# Patient Record
Sex: Female | Born: 1990 | Race: Black or African American | Hispanic: No | Marital: Single | Smoking: Never smoker
Health system: Southern US, Community
[De-identification: ages and names within clinical notes are randomized; demographics above are authoritative.]

## PROBLEM LIST (undated history)

## (undated) DIAGNOSIS — I10 Essential (primary) hypertension: Secondary | ICD-10-CM

---

## 2016-12-14 ENCOUNTER — Emergency Department (HOSPITAL_BASED_OUTPATIENT_CLINIC_OR_DEPARTMENT_OTHER): Payer: Medicaid Other

## 2016-12-14 ENCOUNTER — Encounter (HOSPITAL_BASED_OUTPATIENT_CLINIC_OR_DEPARTMENT_OTHER): Payer: Self-pay | Admitting: Emergency Medicine

## 2016-12-14 ENCOUNTER — Emergency Department (HOSPITAL_BASED_OUTPATIENT_CLINIC_OR_DEPARTMENT_OTHER)
Admission: EM | Admit: 2016-12-14 | Discharge: 2016-12-14 | Disposition: A | Discharge status: Home / Self Care | Payer: Medicaid Other | Attending: Emergency Medicine | Admitting: Emergency Medicine

## 2016-12-14 DIAGNOSIS — R109 Unspecified abdominal pain: Secondary | ICD-10-CM

## 2016-12-14 DIAGNOSIS — R1032 Left lower quadrant pain: Secondary | ICD-10-CM | POA: Diagnosis present

## 2016-12-14 DIAGNOSIS — N12 Tubulo-interstitial nephritis, not specified as acute or chronic: Secondary | ICD-10-CM | POA: Diagnosis not present

## 2016-12-14 DIAGNOSIS — R52 Pain, unspecified: Secondary | ICD-10-CM

## 2016-12-14 LAB — CBC WITH DIFFERENTIAL/PLATELET
BASOS ABS: 0 10*3/uL (ref 0.0–0.1)
BASOS PCT: 0 %
EOS ABS: 0.1 10*3/uL (ref 0.0–0.7)
Eosinophils Relative: 1 %
HCT: 35.9 % — ABNORMAL LOW (ref 36.0–46.0)
Hemoglobin: 11.5 g/dL — ABNORMAL LOW (ref 12.0–15.0)
Lymphocytes Relative: 19 %
Lymphs Abs: 1.5 10*3/uL (ref 0.7–4.0)
MCH: 24.7 pg — ABNORMAL LOW (ref 26.0–34.0)
MCHC: 32 g/dL (ref 30.0–36.0)
MCV: 77 fL — ABNORMAL LOW (ref 78.0–100.0)
MONO ABS: 1.1 10*3/uL — AB (ref 0.1–1.0)
MONOS PCT: 14 %
NEUTROS PCT: 66 %
Neutro Abs: 5.3 10*3/uL (ref 1.7–7.7)
Platelets: 232 10*3/uL (ref 150–400)
RBC: 4.66 MIL/uL (ref 3.87–5.11)
RDW: 15.3 % (ref 11.5–15.5)
WBC: 8 10*3/uL (ref 4.0–10.5)

## 2016-12-14 LAB — BASIC METABOLIC PANEL
Anion gap: 9 (ref 5–15)
BUN: 8 mg/dL (ref 6–20)
CALCIUM: 9.1 mg/dL (ref 8.9–10.3)
CO2: 22 mmol/L (ref 22–32)
CREATININE: 0.75 mg/dL (ref 0.44–1.00)
Chloride: 105 mmol/L (ref 101–111)
GFR calc Af Amer: >60 mL/min (ref >60)
GLUCOSE: 92 mg/dL (ref 65–99)
Potassium: 3.8 mmol/L (ref 3.5–5.1)
Sodium: 136 mmol/L (ref 135–145)

## 2016-12-14 LAB — URINALYSIS, ROUTINE W REFLEX MICROSCOPIC
Bilirubin Urine: NEGATIVE
GLUCOSE, UA: NEGATIVE mg/dL
KETONES UR: NEGATIVE mg/dL
NITRITE: POSITIVE — AB
PROTEIN: 30 mg/dL — AB
Specific Gravity, Urine: 1.013 (ref 1.005–1.030)
pH: 6 (ref 5.0–8.0)

## 2016-12-14 LAB — URINALYSIS, MICROSCOPIC (REFLEX)

## 2016-12-14 LAB — PREGNANCY, URINE: Preg Test, Ur: NEGATIVE

## 2016-12-14 MED ORDER — ONDANSETRON 4 MG PO TBDP
4.0000 mg | ORAL_TABLET | Freq: Once | ORAL | Status: AC
  Administered 2016-12-14: 4 mg via ORAL
  Filled 2016-12-14: qty 1

## 2016-12-14 MED ORDER — HYDROCODONE-ACETAMINOPHEN 5-325 MG PO TABS: 1.0000 | ORAL_TABLET | ORAL | 0 refills | Status: AC | PRN

## 2016-12-14 MED ORDER — PROMETHAZINE HCL 25 MG PO TABS
25.0000 mg | ORAL_TABLET | Freq: Once | ORAL | Status: AC
  Administered 2016-12-14: 25 mg via ORAL
  Filled 2016-12-14: qty 1

## 2016-12-14 MED ORDER — MORPHINE SULFATE (PF) 4 MG/ML IV SOLN
4.0000 mg | Freq: Once | INTRAVENOUS | Status: AC
  Administered 2016-12-14: 4 mg via INTRAMUSCULAR

## 2016-12-14 MED ORDER — ONDANSETRON HCL 4 MG PO TABS: 4.0000 mg | ORAL_TABLET | Freq: Three times a day (TID) | ORAL | 0 refills | Status: AC | PRN

## 2016-12-14 MED ORDER — ONDANSETRON HCL 4 MG/2ML IJ SOLN
4.0000 mg | Freq: Once | INTRAMUSCULAR | Status: DC
  Filled 2016-12-14: qty 2

## 2016-12-14 MED ORDER — SODIUM CHLORIDE 0.9 % IV BOLUS (SEPSIS)
500.0000 mL | Freq: Once | INTRAVENOUS | Status: AC
  Administered 2016-12-14: 500 mL via INTRAVENOUS

## 2016-12-14 MED ORDER — ONDANSETRON HCL 4 MG/2ML IJ SOLN
4.0000 mg | Freq: Once | INTRAMUSCULAR | Status: AC
  Administered 2016-12-14: 4 mg via INTRAVENOUS
  Filled 2016-12-14: qty 2

## 2016-12-14 MED ORDER — MORPHINE SULFATE (PF) 4 MG/ML IV SOLN
4.0000 mg | Freq: Once | INTRAVENOUS | Status: DC
Start: 2016-12-14 — End: 2016-12-14
  Filled 2016-12-14: qty 1

## 2016-12-14 MED ORDER — ONDANSETRON HCL 4 MG PO TABS: 4.0000 mg | ORAL_TABLET | Freq: Three times a day (TID) | ORAL | 0 refills | Status: DC | PRN

## 2016-12-14 MED ORDER — HYDROMORPHONE HCL 1 MG/ML IJ SOLN
1.0000 mg | Freq: Once | INTRAMUSCULAR | Status: AC
  Administered 2016-12-14: 1 mg via INTRAVENOUS
  Filled 2016-12-14: qty 1

## 2016-12-14 MED ORDER — HYDROCODONE-ACETAMINOPHEN 5-325 MG PO TABS: 1.0000 | ORAL_TABLET | ORAL | 0 refills | Status: DC | PRN

## 2016-12-14 MED ORDER — DEXTROSE 5 % IV SOLN
1.0000 g | Freq: Once | INTRAVENOUS | Status: AC
  Administered 2016-12-14: 1 g via INTRAVENOUS
  Filled 2016-12-14: qty 10

## 2016-12-14 MED ORDER — CEPHALEXIN 500 MG PO CAPS: 500.0000 mg | ORAL_CAPSULE | Freq: Three times a day (TID) | ORAL | 0 refills | Status: AC

## 2016-12-14 MED ORDER — CEPHALEXIN 500 MG PO CAPS: 500.0000 mg | ORAL_CAPSULE | Freq: Three times a day (TID) | ORAL | 0 refills | Status: DC

## 2016-12-14 MED FILL — HYDROCODON-APAP 5-325: 5-325 | 2 days supply | Qty: 15 | Fill #0

## 2016-12-14 MED FILL — CEPHALEXIN 500 MG CAPSULE: 500 | 10 days supply | Qty: 30 | Fill #0

## 2016-12-14 MED FILL — ONDANSETRON HCL 4 MG TABLET: 4 | 5 days supply | Qty: 15 | Fill #0

## 2016-12-14 NOTE — ED Triage Notes (Signed)
Patient states that she woke up yesterday with pain after consuming ETOH the night before. The patient reports that she is having pain to her left lower back and radiates to her groin. PAtient states that the pain is worse with movement

## 2016-12-14 NOTE — ED Notes (Signed)
Left flank pain since yesterday, nausea

## 2016-12-14 NOTE — ED Notes (Signed)
Refuses pelvic exam, per ED provider

## 2016-12-14 NOTE — ED Notes (Signed)
Patient transported to Ultrasound 

## 2016-12-14 NOTE — Discharge Instructions (Signed)
Read the information below.  Use the prescribed medication as directed.  Please discuss all new medications with your pharmacist.  Do not take additional tylenol while taking the prescribed pain medication to avoid overdose.  You may return to the Emergency Department at any time for worsening condition or any new symptoms that concern you.    If you develop high fevers, worsening abdominal pain, uncontrolled vomiting, or are unable to tolerate fluids by mouth, return to the ER for a recheck.   °

## 2016-12-14 NOTE — ED Provider Notes (Signed)
[facility]   CSN: [id number] Arrival date & time: 12/14/16  1224     History   Chief Complaint Chief Complaint  Patient presents with  . Back Pain    HPI Stephanie Washington is a 26 y.o. female.  HPI   Patient presents with left flank pain that began last night around 8pm.  States it began just as she took a shot of liquor.  The pain is constant, described as someone "pummeling" her side, with associated pressure in her rectum and nausea.  Has had similar pain with kidney infection.  Denies vomiting, urinary, vaginal, or bowel complaints.  Had normal BM this morning.  LMP was last week and on time.  She is s/p tubal ligation.  Has never had kidney stone or ovarian cyst.  Has been monogamous with one man x 7 years, doubts STI.  Denies any possibility of trauma, no recent falls or injuries.    History reviewed. No pertinent past medical history.  There are no active problems to display for this patient.   History reviewed. No pertinent surgical history.  OB History    No data available       Home Medications    Prior to Admission medications   Medication Sig Start Date End Date Taking? Authorizing Provider  cephALEXin (KEFLEX) 500 MG capsule Take 1 capsule (500 mg total) by mouth 3 (three) times daily. 12/14/16 12/24/16  Trixie DredgeEmily Roderick Calo, PA-C  HYDROcodone-acetaminophen (NORCO/VICODIN) 5-325 MG tablet Take 1-2 tablets by mouth every 4 (four) hours as needed for moderate pain or severe pain. 12/14/16   Trixie DredgeEmily Jubilee Vivero, PA-C  ondansetron (ZOFRAN) 4 MG tablet Take 1 tablet (4 mg total) by mouth every 8 (eight) hours as needed for nausea or vomiting. 12/14/16   Trixie DredgeEmily Karess Harner, PA-C    Family History History reviewed. No pertinent family history.  Social History Social History  Substance Use Topics  . Smoking status: Never Smoker  . Smokeless tobacco: Never Used  . Alcohol use 14.4 oz/week    24 Cans of beer per week     Allergies   Patient has no known  allergies.   Review of Systems Review of Systems  All other systems reviewed and are negative.    Physical Exam Updated Vital Signs BP 100/59 (BP Location: Right Arm)   Pulse 69   Temp 98 F (36.7 C) (Oral)   Resp 16   Ht 5' (1.524 m)   Wt 59 kg   LMP 12/03/2016   SpO2 100%   BMI 25.39 kg/m   Physical Exam  Constitutional: She appears well-developed and well-nourished. No distress.  HENT:  Head: Normocephalic and atraumatic.  Neck: Neck supple.  Cardiovascular: Normal rate and regular rhythm.   Pulmonary/Chest: Effort normal and breath sounds normal. No respiratory distress. She has no wheezes. She has no rales.  Abdominal: Soft. Bowel sounds are normal. She exhibits no distension. There is tenderness in the left upper quadrant and left lower quadrant. There is CVA tenderness (left). There is no rebound and no guarding.  Tenderness throughout left abdomen, flank, and back.  No skin changes.   Neurological: She is alert.  Skin: She is not diaphoretic.  Nursing note and vitals reviewed.    ED Treatments / Results  Labs (all labs ordered are listed, but only abnormal results are displayed) Labs Reviewed  URINALYSIS, ROUTINE W REFLEX MICROSCOPIC - Abnormal; Notable for the following:       Result Value   APPearance CLOUDY (*)  Hgb urine dipstick SMALL (*)    Protein, ur 30 (*)    Nitrite POSITIVE (*)    Leukocytes, UA LARGE (*)    All other components within normal limits  CBC WITH DIFFERENTIAL/PLATELET - Abnormal; Notable for the following:    Hemoglobin 11.5 (*)    HCT 35.9 (*)    MCV 77.0 (*)    MCH 24.7 (*)    Monocytes Absolute 1.1 (*)    All other components within normal limits  URINALYSIS, MICROSCOPIC (REFLEX) - Abnormal; Notable for the following:    Bacteria, UA MANY (*)    Squamous Epithelial / LPF 0-5 (*)    All other components within normal limits  URINE CULTURE  PREGNANCY, URINE  BASIC METABOLIC PANEL  RPR  HIV ANTIBODY (ROUTINE TESTING)     EKG  EKG Interpretation None       Radiology US Transvaginal Non-ob  Result Date: 12/14/2016 CLINICAL DATA:  Severe left flank pain radiating to left lower quadrant EXAM: TRANSABDOMINAL AND TRANSVAGINAL ULTRASOUND OF PELVIS DOPPLER ULTRASOUND OF OVARIES TECHNIQUE: Both transabdominal and transvaginal ultrasound examinations of the pelvis were performed. Transabdominal technique was performed for global imaging of the pelvis including uterus, ovaries, adnexal regions, and pelvic cul-de-sac. It was necessary to proceed with endovaginal exam following the transabdominal exam to visualize the endometrium and bilateral ovaries. Color and duplex Doppler ultrasound was utilized to evaluate blood flow to the ovaries. COMPARISON:  None. FINDINGS: Uterus Measurements: 8.9 x 4.6 x 5.5 cm. No fibroids or other mass visualized. Endometrium Thickness: 7 mm.  No focal abnormality visualized. Right ovary Measurements: 3.4 x 2.0 x 1.9 cm. Normal appearance/no adnexal mass. Left ovary Measurements: 4.2 x 1.4 x 2.1 cm. Normal appearance/no adnexal mass. Pulsed Doppler evaluation of both ovaries demonstrates normal low-resistance arterial and venous waveforms. Other findings Small volume pelvic ascites. IMPRESSION: Negative pelvic ultrasound. No evidence of ovarian torsion. Electronically Signed   By: Charline Bills M.D.   On: 12/14/2016 14:59   US Pelvis Complete  Result Date: 12/14/2016 CLINICAL DATA:  Severe left flank pain radiating to left lower quadrant EXAM: TRANSABDOMINAL AND TRANSVAGINAL ULTRASOUND OF PELVIS DOPPLER ULTRASOUND OF OVARIES TECHNIQUE: Both transabdominal and transvaginal ultrasound examinations of the pelvis were performed. Transabdominal technique was performed for global imaging of the pelvis including uterus, ovaries, adnexal regions, and pelvic cul-de-sac. It was necessary to proceed with endovaginal exam following the transabdominal exam to visualize the endometrium and bilateral  ovaries. Color and duplex Doppler ultrasound was utilized to evaluate blood flow to the ovaries. COMPARISON:  None. FINDINGS: Uterus Measurements: 8.9 x 4.6 x 5.5 cm. No fibroids or other mass visualized. Endometrium Thickness: 7 mm.  No focal abnormality visualized. Right ovary Measurements: 3.4 x 2.0 x 1.9 cm. Normal appearance/no adnexal mass. Left ovary Measurements: 4.2 x 1.4 x 2.1 cm. Normal appearance/no adnexal mass. Pulsed Doppler evaluation of both ovaries demonstrates normal low-resistance arterial and venous waveforms. Other findings Small volume pelvic ascites. IMPRESSION: Negative pelvic ultrasound. No evidence of ovarian torsion. Electronically Signed   By: Charline Bills M.D.   On: 12/14/2016 14:59   US Renal  Result Date: 12/14/2016 CLINICAL DATA:  Severe left flank pain EXAM: RENAL / URINARY TRACT ULTRASOUND COMPLETE COMPARISON:  None FINDINGS: Right Kidney: Length: 10.5 cm. Echogenicity within normal limits. 2.1 cm anechoic right upper pole renal mass most consistent with a cyst. No solid mass or hydronephrosis visualized. Left Kidney: Length: 10.6 cm. Echogenicity within normal limits. No mass or hydronephrosis visualized. Bladder:  Appears normal for degree of bladder distention. IMPRESSION: 1. No obstructive uropathy. 2. Small right renal cyst. Electronically Signed   By: Elige Ko   On: 12/14/2016 15:08   [place] Art/ven Flow Abd Pelv Doppler  Result Date: 12/14/2016 CLINICAL DATA:  Severe left flank pain radiating to left lower quadrant EXAM: TRANSABDOMINAL AND TRANSVAGINAL ULTRASOUND OF PELVIS DOPPLER ULTRASOUND OF OVARIES TECHNIQUE: Both transabdominal and transvaginal ultrasound examinations of the pelvis were performed. Transabdominal technique was performed for global imaging of the pelvis including uterus, ovaries, adnexal regions, and pelvic cul-de-sac. It was necessary to proceed with endovaginal exam following the transabdominal exam to visualize the endometrium and bilateral  ovaries. Color and duplex Doppler ultrasound was utilized to evaluate blood flow to the ovaries. COMPARISON:  None. FINDINGS: Uterus Measurements: 8.9 x 4.6 x 5.5 cm. No fibroids or other mass visualized. Endometrium Thickness: 7 mm.  No focal abnormality visualized. Right ovary Measurements: 3.4 x 2.0 x 1.9 cm. Normal appearance/no adnexal mass. Left ovary Measurements: 4.2 x 1.4 x 2.1 cm. Normal appearance/no adnexal mass. Pulsed Doppler evaluation of both ovaries demonstrates normal low-resistance arterial and venous waveforms. Other findings Small volume pelvic ascites. IMPRESSION: Negative pelvic ultrasound. No evidence of ovarian torsion. Electronically Signed   By: Charline Bills M.D.   On: 12/14/2016 14:59    Procedures Procedures (including critical care time)  Medications Ordered in ED Medications  sodium chloride 0.9 % bolus 500 mL (0 mLs Intravenous Stopped 12/14/16 1513)  morphine 4 MG/ML injection 4 mg (4 mg Intramuscular Given 12/14/16 1316)  ondansetron (ZOFRAN-ODT) disintegrating tablet 4 mg (4 mg Oral Given 12/14/16 1320)  HYDROmorphone (DILAUDID) injection 1 mg (1 mg Intravenous Given 12/14/16 1357)  cefTRIAXone (ROCEPHIN) 1 g in dextrose 5 % 50 mL IVPB (0 g Intravenous Stopped 12/14/16 1513)  ondansetron (ZOFRAN) injection 4 mg (4 mg Intravenous Given 12/14/16 1510)     Initial Impression / Assessment and Plan / ED Course  I have reviewed the triage vital signs and the nursing notes.  Pertinent labs & imaging results that were available during my care of the patient were reviewed by me and considered in my medical decision making (see chart for details).  Clinical Course as of Dec 15 1546  Fri Dec 14, 2016  1342 Pt declines pelvic exam   [EW]    Clinical Course User Index [EW] Trixie Dredge, PA-C    Pt with temp 99.8, nontoxic with sudden onset left flank pain last night without associated symptoms, UA appears infected.  Clinically doubt kidney stone.  Renal and pelvic US  unremarkable.  Labs significant for mild anemia only.  No other abnormalities.  Pt declines pelvic exam, denies possibility of infection.  She is not pregnant.  Will treat as pyelonephritis.  IV rocephin, pain and nausea medication given in ED with good relief.  D/C home with keflex, norco, zofran.  PCP follow up.  Discussed result, findings, treatment, and follow up  with patient.  Pt given return precautions.  Pt verbalizes understanding and agrees with plan.      Final Clinical Impressions(s) / ED Diagnoses   Final diagnoses:  Pain  Left flank pain  Pyelonephritis    New Prescriptions Current Discharge Medication List    START taking these medications   Details  cephALEXin (KEFLEX) 500 MG capsule Take 1 capsule (500 mg total) by mouth 3 (three) times daily. Qty: 30 capsule, Refills: 0    HYDROcodone-acetaminophen (NORCO/VICODIN) 5-325 MG tablet Take 1-2 tablets by mouth every  4 (four) hours as needed for moderate pain or severe pain. Qty: 15 tablet, Refills: 0    ondansetron (ZOFRAN) 4 MG tablet Take 1 tablet (4 mg total) by mouth every 8 (eight) hours as needed for nausea or vomiting. Qty: 15 tablet, Refills: 0         Trixie Dredge, PA-C 12/14/16 1552    Cathren Laine, MD 12/16/16 0020

## 2016-12-14 NOTE — ED Notes (Signed)
Attempted IV x 2, unsuccessful, tol well, ED provider informed .

## 2016-12-14 NOTE — ED Notes (Addendum)
Pt directed to pharmacy to pick up Rx. Pt has a ride at bedside 

## 2016-12-14 NOTE — ED Notes (Signed)
Pt walking from room at d/c. Became nauseated and vomited x 1. EDPA Roxy Horsemanobert Browning made aware and VORB received for phenergan 25mg  PO. Pt has ride and is going to pharmacy to pick up other medications

## 2016-12-16 LAB — URINE CULTURE

## 2016-12-16 LAB — HIV ANTIBODY (ROUTINE TESTING W REFLEX): HIV Screen 4th Generation wRfx: NONREACTIVE

## 2016-12-17 ENCOUNTER — Telehealth: Payer: Self-pay | Admitting: Emergency Medicine

## 2016-12-17 NOTE — Telephone Encounter (Signed)
Post ED Visit - Positive Culture Follow-up  Culture report reviewed by antimicrobial stewardship pharmacist:  []  Stephanie Washington, Pharm.D. []  Stephanie Washington, Pharm.D., BCPS []  Stephanie Washington, Pharm.D. []  Stephanie Washington, Pharm.D., BCPS []  Stephanie Washington, [address]Ph[address] BCPS, AAHIVP [x]  Stephanie Washington, Pharm.D., BCPS, AAHIVP []  Stephanie Washington, Pharm.D. []  Stephanie Washington, [address]  Positive urine culture Treated with cephalexin, organism sensitive to the same and no further patient follow-up is required at this time.  Stephanie Washington, Stephanie Washington 12/17/2016, 4:46 PM

## 2016-12-18 LAB — RPR: RPR Ser Ql: NONREACTIVE

## 2017-05-13 IMAGING — US US RENAL
1 series · 14 of 25 positions shown · non-contrast
Comparison: None

CLINICAL DATA: Severe left flank pain

EXAM:
RENAL / URINARY TRACT ULTRASOUND COMPLETE

[Series 1: us renal · 0.18mm/px · 14 of 30 slices shown]
[im 1/30]
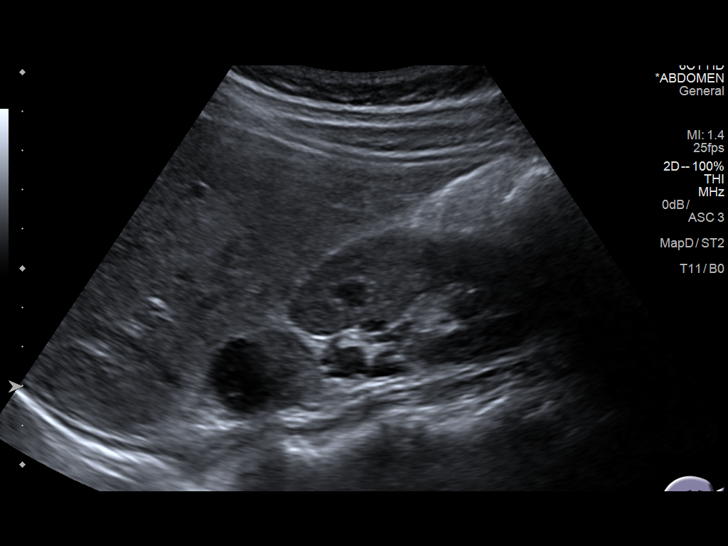
[im 3/30]
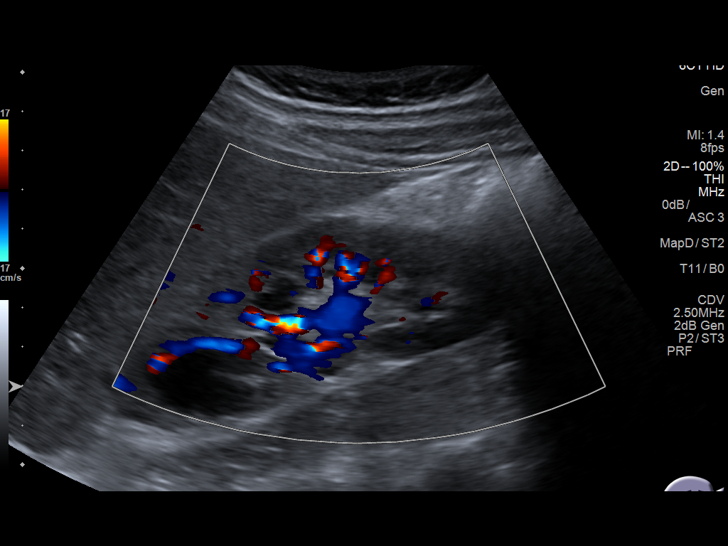
[im 5/30]
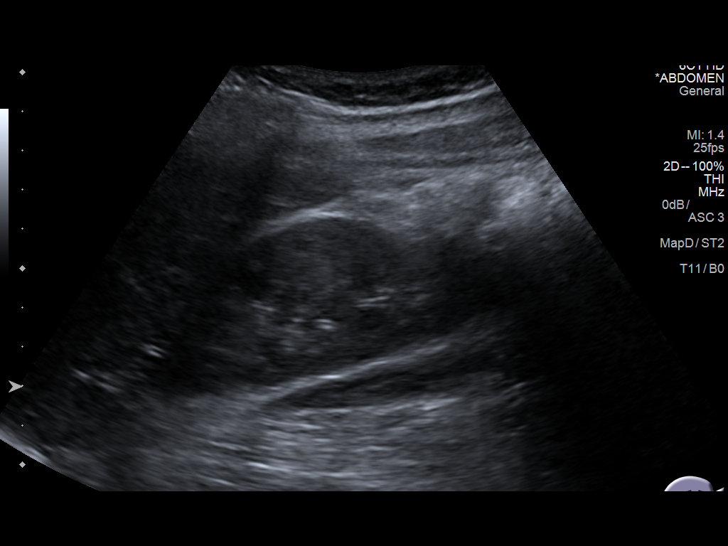
[im 8/30]
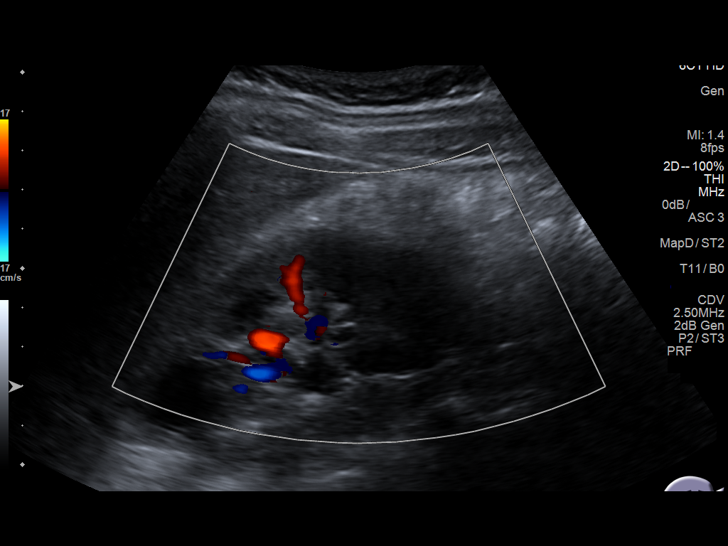
[im 10/30]
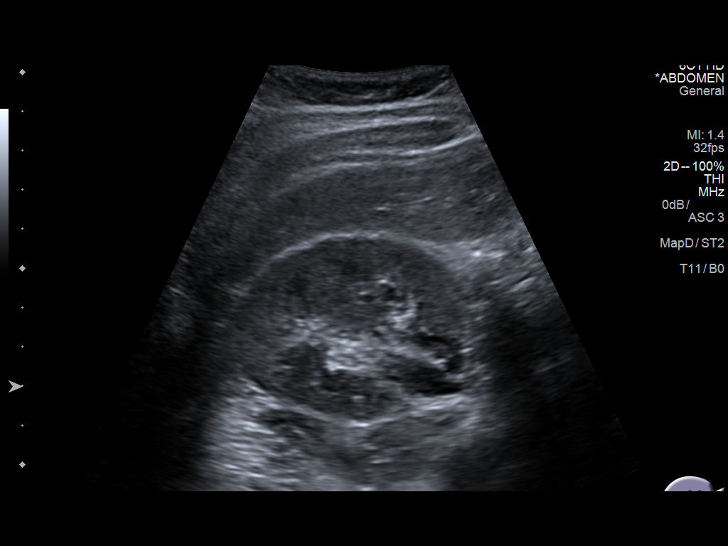
[im 11/30]
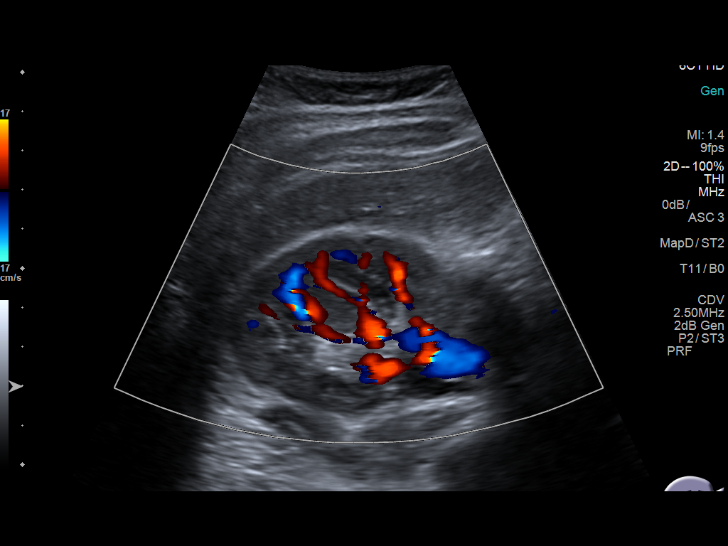
[im 14/30]
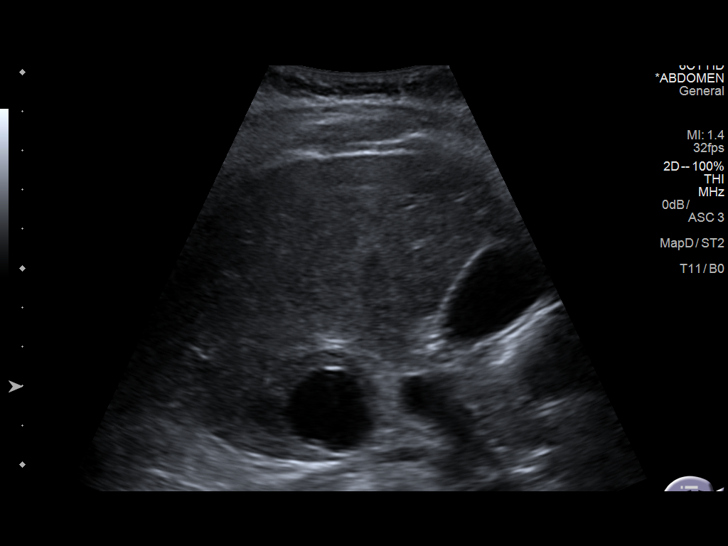
[im 16/30]
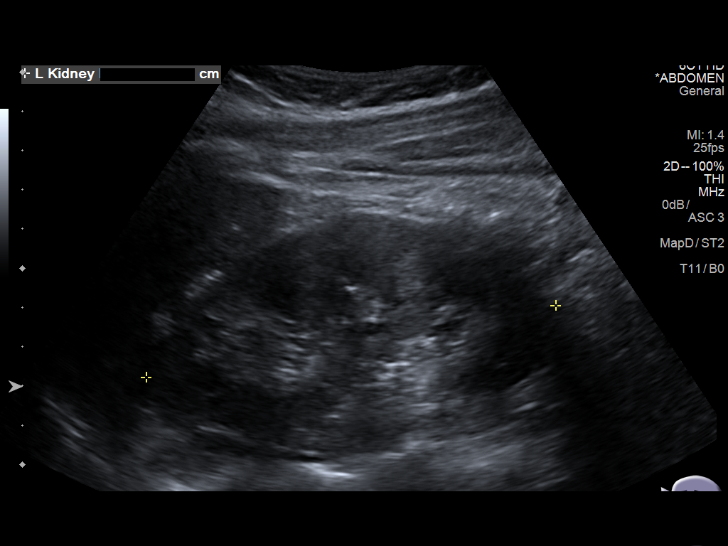
[im 19/30]
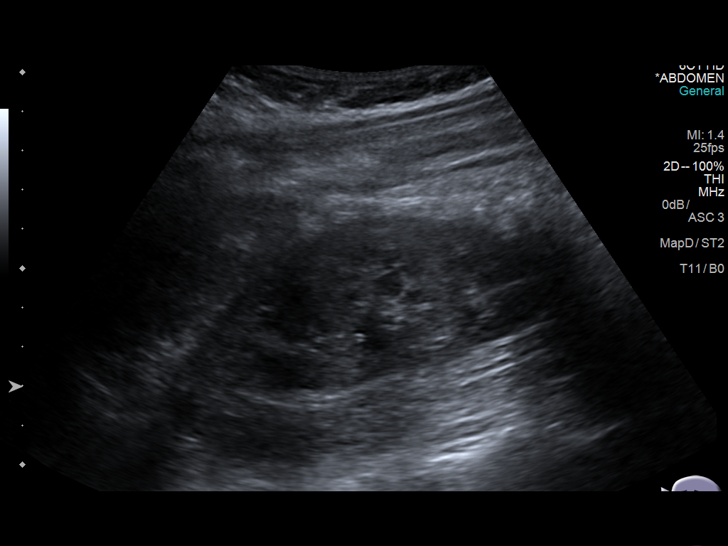
[im 20/30]
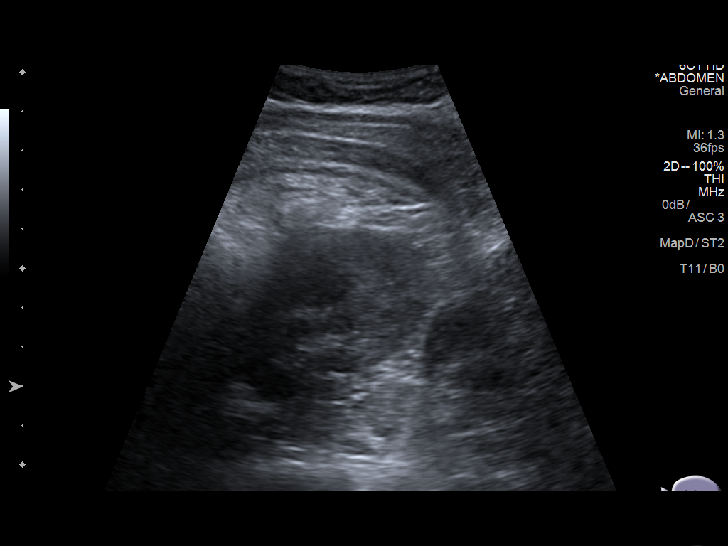
[im 22/30]
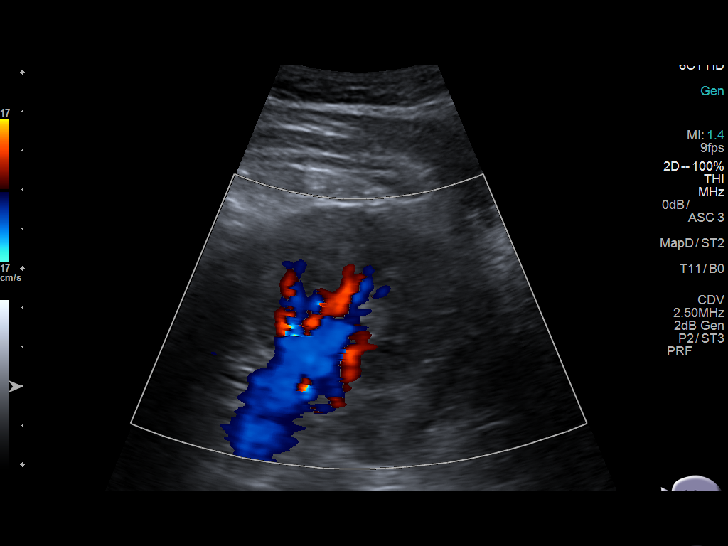
[im 25/30]
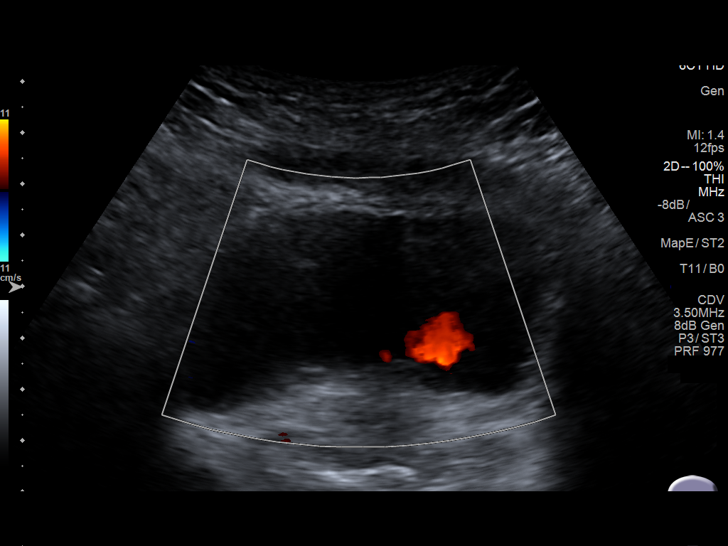
[im 27/30]
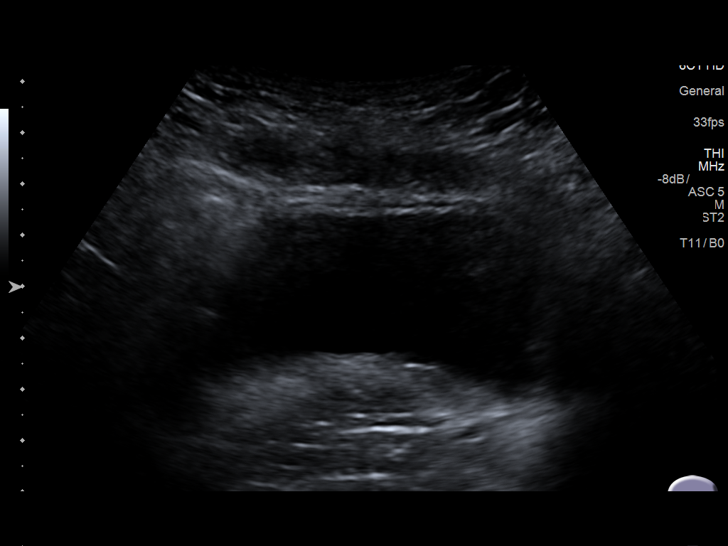
[im 30/30]
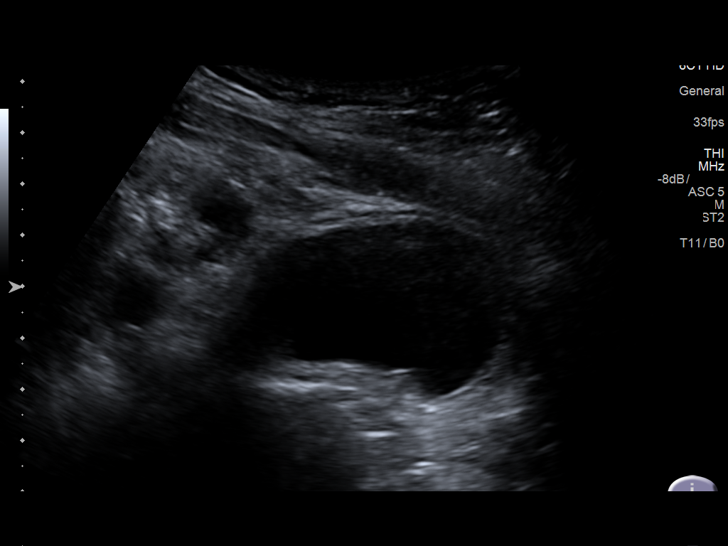

[14 of 25 positions shown; findings below may reference images not displayed]

FINDINGS: Right Kidney:

Length: 10.5 cm. Echogenicity within normal limits. 2.1 cm anechoic
right upper pole renal mass most consistent with a cyst. No solid
mass or hydronephrosis visualized.

Left Kidney:

Length: 10.6 cm. Echogenicity within normal limits. No mass or
hydronephrosis visualized.

Bladder:

Appears normal for degree of bladder distention.
IMPRESSION: 1. No obstructive uropathy.
2. Small right renal cyst.

## 2020-09-21 ENCOUNTER — Emergency Department (HOSPITAL_BASED_OUTPATIENT_CLINIC_OR_DEPARTMENT_OTHER): Payer: Medicaid Other

## 2020-09-21 ENCOUNTER — Other Ambulatory Visit: Payer: Self-pay

## 2020-09-21 ENCOUNTER — Encounter (HOSPITAL_BASED_OUTPATIENT_CLINIC_OR_DEPARTMENT_OTHER): Payer: Self-pay | Admitting: *Deleted

## 2020-09-21 ENCOUNTER — Emergency Department (HOSPITAL_BASED_OUTPATIENT_CLINIC_OR_DEPARTMENT_OTHER)
Admission: EM | Admit: 2020-09-21 | Discharge: 2020-09-21 | Disposition: A | Discharge status: Home / Self Care | Payer: Medicaid Other | Attending: Emergency Medicine | Admitting: Emergency Medicine

## 2020-09-21 DIAGNOSIS — R1084 Generalized abdominal pain: Secondary | ICD-10-CM | POA: Diagnosis present

## 2020-09-21 DIAGNOSIS — N281 Cyst of kidney, acquired: Secondary | ICD-10-CM | POA: Diagnosis not present

## 2020-09-21 DIAGNOSIS — N83201 Unspecified ovarian cyst, right side: Secondary | ICD-10-CM | POA: Diagnosis not present

## 2020-09-21 LAB — URINALYSIS, ROUTINE W REFLEX MICROSCOPIC
Bilirubin Urine: NEGATIVE
Glucose, UA: NEGATIVE mg/dL
Hgb urine dipstick: NEGATIVE
Ketones, ur: NEGATIVE mg/dL
Leukocytes,Ua: NEGATIVE
Nitrite: NEGATIVE
Protein, ur: NEGATIVE mg/dL
Specific Gravity, Urine: 1.02 (ref 1.005–1.030)
pH: >9 — ABNORMAL HIGH (ref 5.0–8.0)

## 2020-09-21 LAB — CBC WITH DIFFERENTIAL/PLATELET
Abs Immature Granulocytes: 0.01 10*3/uL (ref 0.00–0.07)
Basophils Absolute: 0 10*3/uL (ref 0.0–0.1)
Basophils Relative: 0 %
Eosinophils Absolute: 0 10*3/uL (ref 0.0–0.5)
Eosinophils Relative: 1 %
HCT: 40.5 % (ref 36.0–46.0)
Hemoglobin: 12.7 g/dL (ref 12.0–15.0)
Immature Granulocytes: 0 %
Lymphocytes Relative: 23 %
Lymphs Abs: 1.3 10*3/uL (ref 0.7–4.0)
MCH: 25.6 pg — ABNORMAL LOW (ref 26.0–34.0)
MCHC: 31.4 g/dL (ref 30.0–36.0)
MCV: 81.5 fL (ref 80.0–100.0)
Monocytes Absolute: 0.3 10*3/uL (ref 0.1–1.0)
Monocytes Relative: 5 %
Neutro Abs: 4.2 10*3/uL (ref 1.7–7.7)
Neutrophils Relative %: 71 %
Platelets: 236 10*3/uL (ref 150–400)
RBC: 4.97 MIL/uL (ref 3.87–5.11)
RDW: 15.4 % (ref 11.5–15.5)
WBC: 5.9 10*3/uL (ref 4.0–10.5)
nRBC: 0 % (ref 0.0–0.2)

## 2020-09-21 LAB — COMPREHENSIVE METABOLIC PANEL
ALT: 16 U/L (ref 0–44)
AST: 25 U/L (ref 15–41)
Albumin: 4.6 g/dL (ref 3.5–5.0)
Alkaline Phosphatase: 65 U/L (ref 38–126)
Anion gap: 11 (ref 5–15)
BUN: 11 mg/dL (ref 6–20)
CO2: 23 mmol/L (ref 22–32)
Calcium: 9.5 mg/dL (ref 8.9–10.3)
Chloride: 103 mmol/L (ref 98–111)
Creatinine, Ser: 0.61 mg/dL (ref 0.44–1.00)
GFR, Estimated: >60 mL/min (ref >60)
Glucose, Bld: 112 mg/dL — ABNORMAL HIGH (ref 70–99)
Potassium: 3.8 mmol/L (ref 3.5–5.1)
Sodium: 137 mmol/L (ref 135–145)
Total Bilirubin: 0.8 mg/dL (ref 0.3–1.2)
Total Protein: 9 g/dL — ABNORMAL HIGH (ref 6.5–8.1)

## 2020-09-21 LAB — LIPASE, BLOOD: Lipase: 29 U/L (ref 11–51)

## 2020-09-21 LAB — WET PREP, GENITAL
Sperm: NONE SEEN
Trich, Wet Prep: NONE SEEN
Yeast Wet Prep HPF POC: NONE SEEN

## 2020-09-21 LAB — PREGNANCY, URINE: Preg Test, Ur: NEGATIVE

## 2020-09-21 MED ORDER — IOHEXOL 300 MG/ML  SOLN
100.0000 mL | Freq: Once | INTRAMUSCULAR | Status: AC | PRN
  Administered 2020-09-21: 80 mL via INTRAVENOUS

## 2020-09-21 MED ORDER — FENTANYL CITRATE (PF) 100 MCG/2ML IJ SOLN
50.0000 ug | Freq: Once | INTRAMUSCULAR | Status: AC
  Administered 2020-09-21: 50 ug via INTRAVENOUS
  Filled 2020-09-21: qty 2

## 2020-09-21 MED ORDER — ONDANSETRON HCL 4 MG/2ML IJ SOLN
4.0000 mg | Freq: Once | INTRAMUSCULAR | Status: AC
  Administered 2020-09-21: 4 mg via INTRAVENOUS
  Filled 2020-09-21: qty 2

## 2020-09-21 MED ORDER — KETOROLAC TROMETHAMINE 30 MG/ML IJ SOLN
30.0000 mg | Freq: Once | INTRAMUSCULAR | Status: AC
  Administered 2020-09-21: 30 mg via INTRAVENOUS
  Filled 2020-09-21: qty 1

## 2020-09-21 NOTE — ED Notes (Signed)
Pt crawling around on lobby floor

## 2020-09-21 NOTE — ED Triage Notes (Signed)
C/o abd pain x 5 hrs

## 2020-09-21 NOTE — ED Provider Notes (Signed)
[facility] EMERGENCY DEPARTMENT Provider Note   CSN: [id number] Arrival date & time: 09/21/20  1352     History Chief Complaint  Patient presents with  . Abdominal Pain    Stephanie Washington is a 29 y.o. female who presents for evaluation of abdominal pain that began this morning.  She woke up this morning around 8 AM and started having some abdominal pain.  She states it is all over but hurts in the middle and lower part.  She states that she had nausea/vomiting associated with it.  No diarrhea.  Her last bowel movement was today was normal.  She states she has not taken anything for the pain.  She states that she had not had any dysuria, hematuria.  Denies any vaginal bleeding, vaginal discharge.  Her LMP was about 2 weeks ago.  Patient states she did not eat any abnormal food.  She does not normally have stomach issues.  She denies any fevers, chest pain, difficulty breathing.  The history is provided by the patient.       History reviewed. No pertinent past medical history.  There are no problems to display for this patient.   Past Surgical History:  Procedure Laterality Date  . CESAREAN SECTION       OB History   No obstetric history on file.     No family history on file.  Social History   Tobacco Use  . Smoking status: Never Smoker  . Smokeless tobacco: Never Used  Substance Use Topics  . Alcohol use: Yes    Alcohol/week: 24.0 standard drinks    Types: 24 Cans of beer per week  . Drug use: No    Home Medications Prior to Admission medications   Medication Sig Start Date End Date Taking? Authorizing Provider  HYDROcodone-acetaminophen (NORCO/VICODIN) 5-325 MG tablet Take 1-2 tablets by mouth every 4 (four) hours as needed for moderate pain or severe pain. 12/14/16   Trixie Dredge, PA-C  ondansetron (ZOFRAN) 4 MG tablet Take 1 tablet (4 mg total) by mouth every 8 (eight) hours as needed for nausea or vomiting. 12/14/16   Trixie Dredge, PA-C    Allergies     Patient has no known allergies.  Review of Systems   Review of Systems  Constitutional: Negative for fever.  Respiratory: Negative for cough and shortness of breath.   Cardiovascular: Negative for chest pain.  Gastrointestinal: Positive for abdominal pain, nausea and vomiting.  Genitourinary: Negative for dysuria and hematuria.  Neurological: Negative for headaches.  All other systems reviewed and are negative.   Physical Exam Updated Vital Signs BP (!) 129/95   Pulse 78   Temp 98 F (36.7 C)   Resp 18   Ht 5' (1.524 m)   Wt 65.8 kg   LMP 09/06/2020   SpO2 99%   BMI 28.32 kg/m   Physical Exam Vitals and nursing note reviewed. Exam conducted with a chaperone present.  Constitutional:      Appearance: Normal appearance. She is well-developed.  HENT:     Head: Normocephalic and atraumatic.  Eyes:     General: Lids are normal.     Conjunctiva/sclera: Conjunctivae normal.     Pupils: Pupils are equal, round, and reactive to light.  Cardiovascular:     Rate and Rhythm: Normal rate and regular rhythm.     Pulses: Normal pulses.     Heart sounds: Normal heart sounds. No murmur heard.  No friction rub. No gallop.   Pulmonary:  Effort: Pulmonary effort is normal.     Breath sounds: Normal breath sounds.  Abdominal:     Palpations: Abdomen is soft. Abdomen is not rigid.     Tenderness: There is abdominal tenderness in the right lower quadrant, periumbilical area, suprapubic area and left lower quadrant. There is no guarding.     Comments: Abdomen is soft, non-distended. No rigidity, No guarding. No peritoneal signs. Tenderness noted to the lower abdomen diffusely with no focal point.   Genitourinary:    Cervix: No cervical motion tenderness.     Adnexa:        Right: No mass or tenderness.         Left: No mass or tenderness.       Comments: The exam was performed with a chaperone present. Normal external female genitalia. No lesions, rash, or sores.  No CMT.  No  adnexal mass or tenderness noted bilaterally.  No discharge noted. Musculoskeletal:        General: Normal range of motion.     Cervical back: Full passive range of motion without pain.  Skin:    General: Skin is warm and dry.     Capillary Refill: Capillary refill takes less than 2 seconds.  Neurological:     Mental Status: She is alert and oriented to person, place, and time.  Psychiatric:        Speech: Speech normal.     ED Results / Procedures / Treatments   Labs (all labs ordered are listed, but only abnormal results are displayed) Labs Reviewed  WET PREP, GENITAL - Abnormal; Notable for the following components:      Result Value   Clue Cells Wet Prep HPF POC PRESENT (*)    WBC, Wet Prep HPF POC RARE (*)    All other components within normal limits  URINALYSIS, ROUTINE W REFLEX MICROSCOPIC - Abnormal; Notable for the following components:   APPearance HAZY (*)    pH >9.0 (*)    All other components within normal limits  CBC WITH DIFFERENTIAL/PLATELET - Abnormal; Notable for the following components:   MCH 25.6 (*)    All other components within normal limits  COMPREHENSIVE METABOLIC PANEL - Abnormal; Notable for the following components:   Glucose, Bld 112 (*)    Total Protein 9.0 (*)    All other components within normal limits  PREGNANCY, URINE  LIPASE, BLOOD  GC/CHLAMYDIA PROBE AMP ([place]) NOT AT [facility]    EKG None  Radiology CT ABDOMEN PELVIS W CONTRAST  Result Date: 09/21/2020 CLINICAL DATA:  Abdominal pain acute nonlocalized. EXAM: CT ABDOMEN AND PELVIS WITH CONTRAST TECHNIQUE: Multidetector CT imaging of the abdomen and pelvis was performed using the standard protocol following bolus administration of intravenous contrast. CONTRAST:  110mL OMNIPAQUE IOHEXOL 300 MG/ML  SOLN COMPARISON:  Ultrasound renal 12/14/2016, ultrasound pelvis 12/14/2016 FINDINGS: Lower chest: No acute abnormality. Hepatobiliary: No focal liver abnormality. No gallstones, gallbladder  wall thickening, or pericholecystic fluid. No biliary dilatation. Pancreas: No focal lesion. Normal pancreatic contour. No surrounding inflammatory changes. No main pancreatic ductal dilatation. Spleen: Normal in size without focal abnormality. Adrenals/Urinary Tract: No adrenal nodule bilaterally. Bilateral kidneys enhance symmetrically. Slight interval increase in size of a 2.8 cm simple fluid lesion within the superior pole of the right kidney that likely represents a simple renal cyst. Subcentimeter hypodensities are too small to characterize. No hydronephrosis. No hydroureter. The urinary bladder is unremarkable. Stomach/Bowel: Stomach is within normal limits. There is a 1.6 cm fluid  density lesion along the lower anterior small-bowel leftover present fluid within the lumen of the small bowel versus a duplication cyst (2:43, 6:61). No evidence of bowel wall thickening or dilatation. Appendix appears normal. Vascular/Lymphatic: No abdominal aorta or iliac aneurysm. No abdominal, pelvic, or inguinal lymphadenopathy. Reproductive: The uterus is retroflexed. A corpus luteum cyst is noted within the right ovary. Otherwise the ureter, bilateral ovaries, and bilateral adnexa are unremarkable. Other: No intraperitoneal free fluid. No intraperitoneal free gas. No organized fluid collection. Musculoskeletal: Suggestion of diastasis rectus.  No definite ventral wall hernia. No suspicious lytic or blastic osseous lesions. No acute displaced fracture. Multilevel degenerative changes of the spine. IMPRESSION: No acute intra-abdominal or intrapelvic abnormality. Electronically Signed   By: Tish Frederickson M.D.   On: 09/21/2020 19:04    Procedures Procedures (including critical care time)  Medications Ordered in ED Medications  ondansetron (ZOFRAN) injection 4 mg (4 mg Intravenous Given 09/21/20 1738)  fentaNYL (SUBLIMAZE) injection 50 mcg (50 mcg Intravenous Given 09/21/20 1735)  iohexol (OMNIPAQUE) 300 MG/ML solution  100 mL (80 mLs Intravenous Contrast Given 09/21/20 1817)  ketorolac (TORADOL) 30 MG/ML injection 30 mg (30 mg Intravenous Given 09/21/20 1914)    ED Course  I have reviewed the triage vital signs and the nursing notes.  Pertinent labs & imaging results that were available during my care of the patient were reviewed by me and considered in my medical decision making (see chart for details).    MDM Rules/Calculators/A&P                          29 year old female who presents for evaluation of abdominal pain.  She reports this started this morning.  Associated nausea/vomiting.  No urinary complaints, fever, diarrhea.  Initial arrival, she is afebrile, nontoxic-appearing.  Vital signs are stable.  On exam, she has some diffuse tenderness noted to the mid and lower abdomen.  No rigidity, guarding.  No CVA tenderness.  Consider viral GI process versus infectious process.  Doubt appendicitis but is a consideration.  History/physical exam not concerning for ovarian torsion.  Doubt GU etiology but is consideration.  CBC shows no leukocytosis.  CMP shows normal BUN creatinine.  UA negative for any infectious etiology.  Urine pregnancy negative.  No indication for ultrasound.  Pelvic exam as documented above.  No CMT that would be concerning for PID.  No adnexal mass or tenderness.  No discharge noted.  CT scan shows 2.8 cm cyst noted to the right kidney.  No hydronephrosis.  No evidence of appendicitis.  Appendix appears normal.  She has a corpus luteum cyst noted in the right ovary.  Otherwise ovaries look unremarkable.  Discussed results patient.  She is sitting comfortably on bed and reports improvement in pain.  She is drinking ginger ale without any nausea/vomiting.  Repeat abdominal exam is improved.  Patient states she is ready to go home.  I discussed with her regarding her CT findings.  I discussed that the ovarian cyst could be contributing to her pain.  Encouraged at home supportive care  measures.  We also discussed other findings on CT scan. At this time, patient exhibits no emergent life-threatening condition that require further evaluation in ED. Patient had ample opportunity for questions and discussion. All patient's questions were answered with full understanding. Strict return precautions discussed. Patient expresses understanding and agreement to plan.   Portions of this note were generated with Scientist, clinical (histocompatibility and immunogenetics). Dictation errors may occur  despite best attempts at proofreading.   Final Clinical Impression(s) / ED Diagnoses Final diagnoses:  Generalized abdominal pain  Cyst of right ovary  Renal cyst    Rx / DC Orders ED Discharge Orders    None       Rosana HoesLayden, Andri Prestia A, PA-C 09/21/20 1950    Pollyann SavoySheldon, Charles B, MD 09/21/20 2049

## 2020-09-21 NOTE — ED Notes (Signed)
PO ginger ale given

## 2020-09-21 NOTE — Discharge Instructions (Signed)
As we discussed, your work-up today was reassuring.  Your CT scan did not show any signs of infection.  Did mention a right ovarian cyst which could be contributing to your pain.  Additionally, as we discussed, there was a small cyst noted on the right kidney.  This is most likely not contributing to your pain.  You just need to follow-up with your primary care doctor.  As we discussed, ovarian cysts are common.  Follow-up with OB/GYN.  You can take Tylenol or Ibuprofen as directed for pain. You can alternate Tylenol and Ibuprofen every 4 hours. If you take Tylenol at 1pm, then you can take Ibuprofen at 5pm. Then you can take Tylenol again at 9pm.   Return to the emergency department for any worsening pain, fevers, vomiting or any other worsening concerning symptoms.

## 2020-09-21 NOTE — ED Notes (Signed)
Talking and texting  While sitting in w/c in lobby

## 2020-09-22 LAB — GC/CHLAMYDIA PROBE AMP (~~LOC~~) NOT AT ARMC
Chlamydia: NEGATIVE
Comment: NEGATIVE
Comment: NORMAL
Neisseria Gonorrhea: NEGATIVE

## 2021-11-17 IMAGING — CT CT ABD-PELV W/ CM
2 of 4 series · 16 of 46 positions shown, 18 images · IV contrast (Omnipaque)
Comparison: Ultrasound renal 12/14/2016, ultrasound pelvis
12/14/2016

CLINICAL DATA: Abdominal pain acute nonlocalized.

EXAM:
CT ABDOMEN AND PELVIS WITH CONTRAST
TECHNIQUE: Multidetector CT imaging of the abdomen and pelvis was performed
using the standard protocol following bolus administration of
intravenous contrast.
CONTRAST:  80mL OMNIPAQUE IOHEXOL 300 MG/ML  SOLN

[Series 2: axial st · axial · 0.60mm/px · z∈[+375,+725]mm · 13 of 78 slices shown, 15 images]
[im 4/78  soft-tissue]
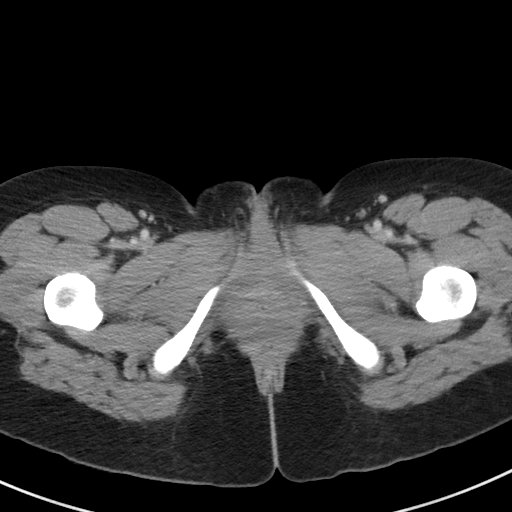
[im 4/78  bone]
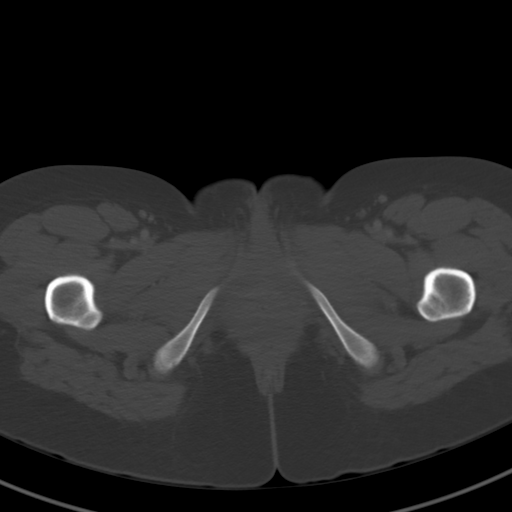
[im 10/78  soft-tissue]
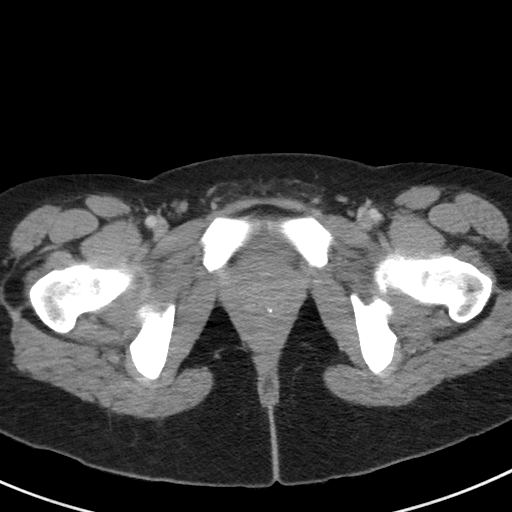
[im 17/78  soft-tissue]
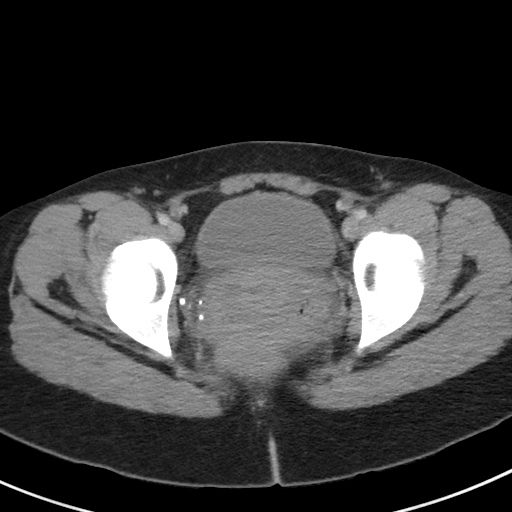
[im 23/78  soft-tissue]
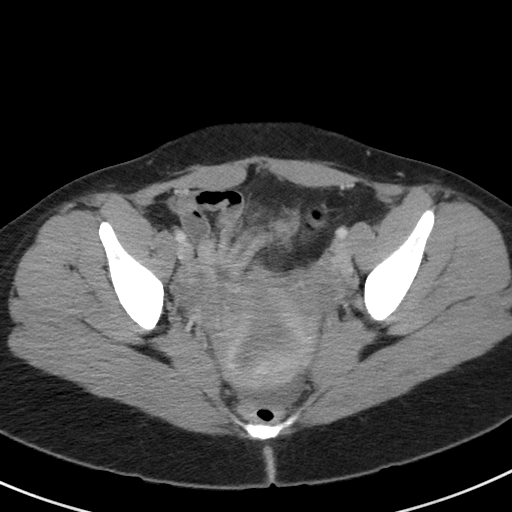
[im 26/78  soft-tissue]
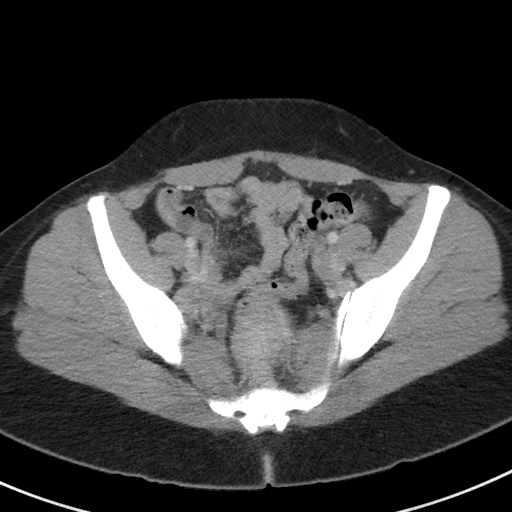
[im 33/78  soft-tissue]
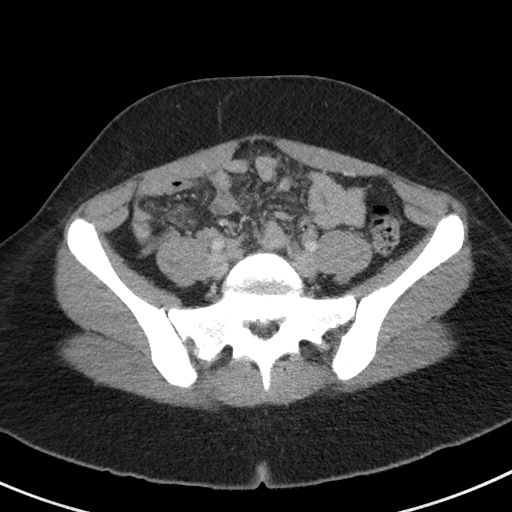
[im 39/78  soft-tissue]
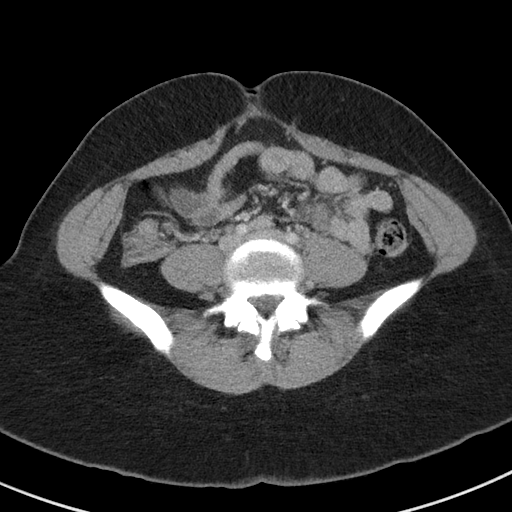
[im 45/78  soft-tissue]
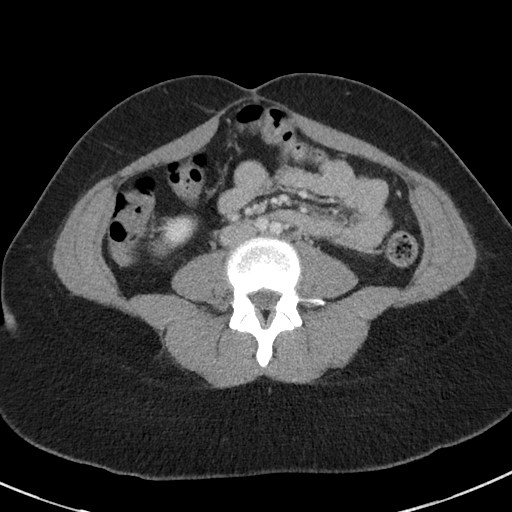
[im 52/78  soft-tissue]
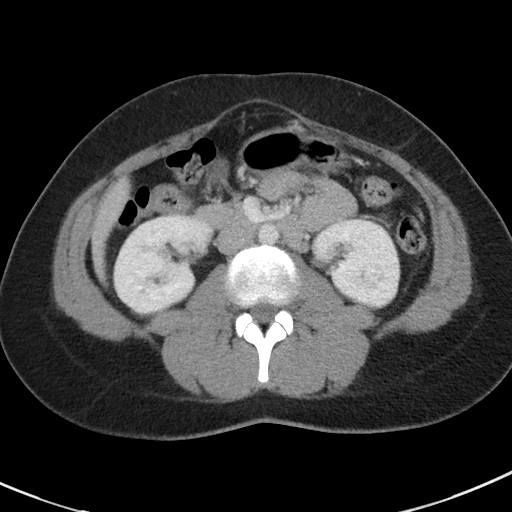
[im 52/78  bone]
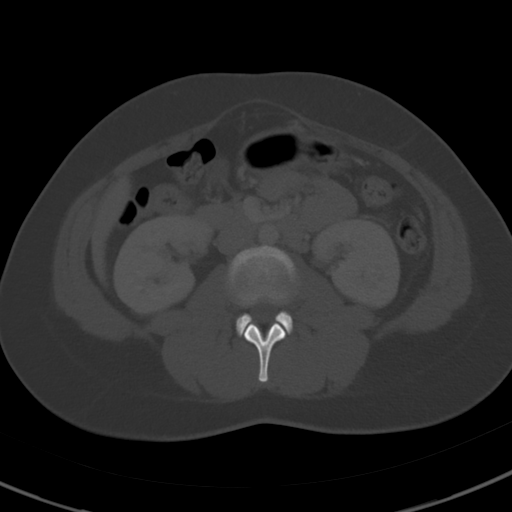
[im 55/78  soft-tissue]
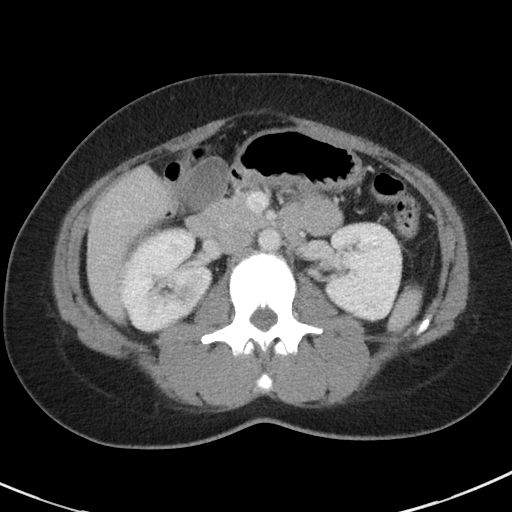
[im 61/78  soft-tissue]
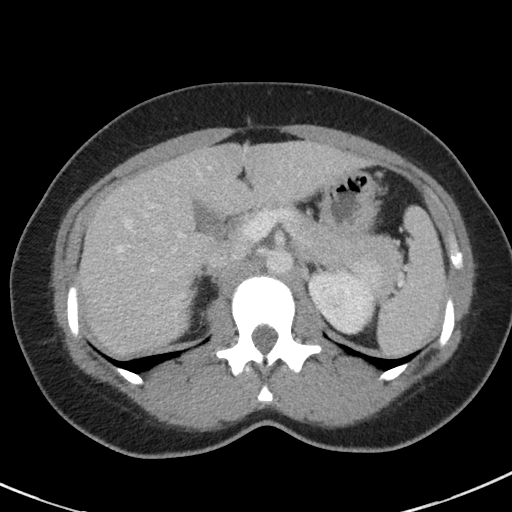
[im 68/78  soft-tissue]
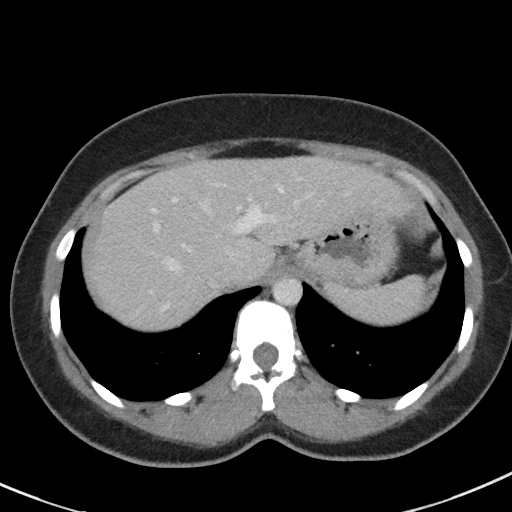
[im 74/78  soft-tissue]
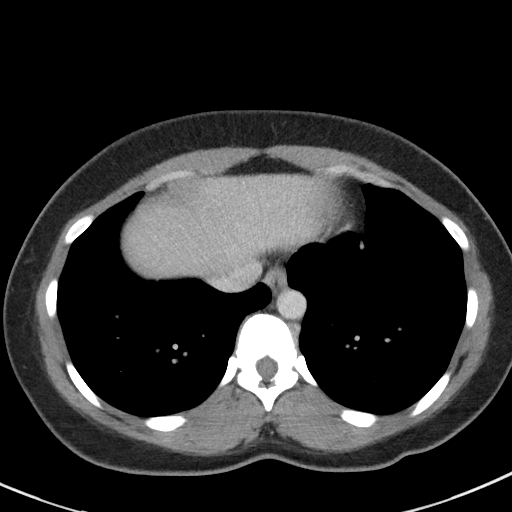

[Series 5: coronal st · coronal · 0.62mm/px · 3 of 90 slices shown]
[im 30/90  soft-tissue]
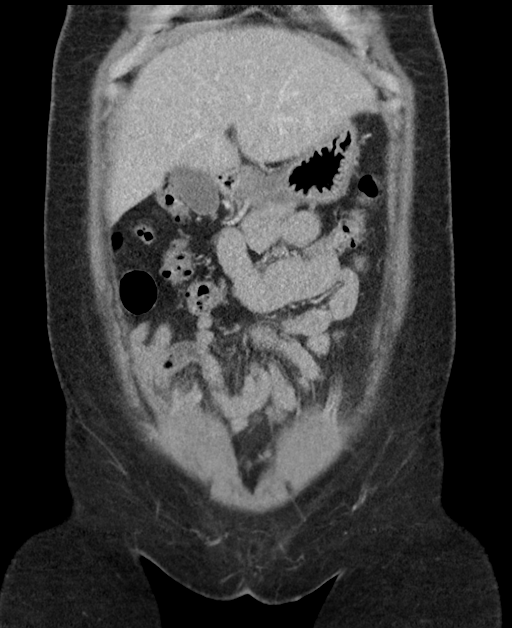
[im 40/90  soft-tissue]
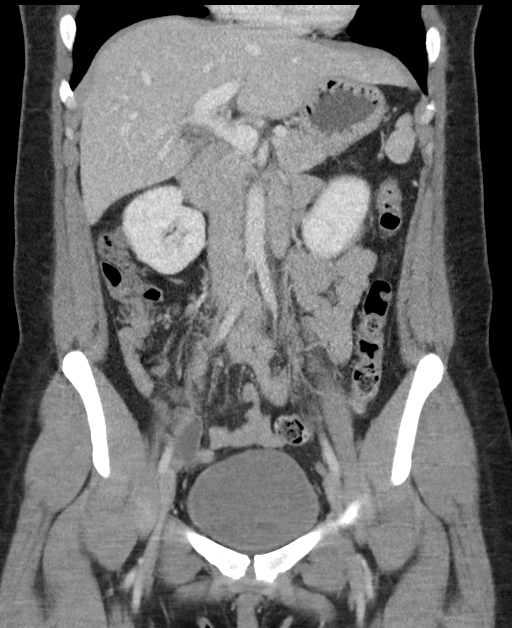
[im 50/90  soft-tissue]
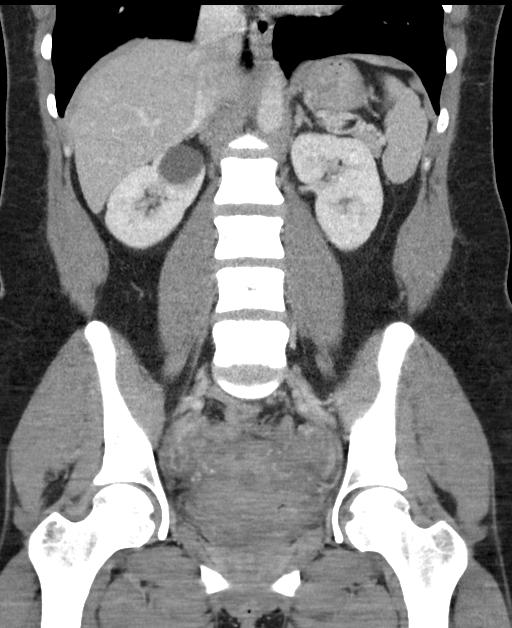

[16 of 46 positions shown; findings below may reference images not displayed]

FINDINGS: Lower chest: No acute abnormality.

Hepatobiliary: No focal liver abnormality. No gallstones,
gallbladder wall thickening, or pericholecystic fluid. No biliary
dilatation.

Pancreas: No focal lesion. Normal pancreatic contour. No surrounding
inflammatory changes. No main pancreatic ductal dilatation.

Spleen: Normal in size without focal abnormality.

Adrenals/Urinary Tract: No adrenal nodule bilaterally. Bilateral
kidneys enhance symmetrically. Slight interval increase in size of a
2.8 cm simple fluid lesion within the superior pole of the right
kidney that likely represents a simple renal cyst. Subcentimeter
hypodensities are too small to characterize. No hydronephrosis. No
hydroureter. The urinary bladder is unremarkable.

Stomach/Bowel: Stomach is within normal limits. There is a 1.6 cm
fluid density lesion along the lower anterior small-bowel leftover
present fluid within the lumen of the small bowel versus a
duplication cyst ([DATE], [DATE]). No evidence of bowel wall thickening
or dilatation. Appendix appears normal.

Vascular/Lymphatic: No abdominal aorta or iliac aneurysm. No
abdominal, pelvic, or inguinal lymphadenopathy.

Reproductive: The uterus is retroflexed. A corpus luteum cyst is
noted within the right ovary. Otherwise the ureter, bilateral
ovaries, and bilateral adnexa are unremarkable.

Other: No intraperitoneal free fluid. No intraperitoneal free gas.
No organized fluid collection.

Musculoskeletal:

Suggestion of diastasis rectus.  No definite ventral wall hernia.

No suspicious lytic or blastic osseous lesions. No acute displaced
fracture. Multilevel degenerative changes of the spine.
IMPRESSION: No acute intra-abdominal or intrapelvic abnormality.

## 2022-09-05 ENCOUNTER — Emergency Department (HOSPITAL_BASED_OUTPATIENT_CLINIC_OR_DEPARTMENT_OTHER)
Admission: EM | Admit: 2022-09-05 | Discharge: 2022-09-05 | Disposition: A | Discharge status: Home / Self Care | Payer: Medicaid Other | Attending: Emergency Medicine | Admitting: Emergency Medicine

## 2022-09-05 ENCOUNTER — Emergency Department (HOSPITAL_BASED_OUTPATIENT_CLINIC_OR_DEPARTMENT_OTHER): Payer: Medicaid Other

## 2022-09-05 ENCOUNTER — Emergency Department (HOSPITAL_COMMUNITY): Payer: Medicaid Other

## 2022-09-05 ENCOUNTER — Encounter (HOSPITAL_BASED_OUTPATIENT_CLINIC_OR_DEPARTMENT_OTHER): Payer: Self-pay | Admitting: Emergency Medicine

## 2022-09-05 ENCOUNTER — Other Ambulatory Visit: Payer: Self-pay

## 2022-09-05 DIAGNOSIS — R1084 Generalized abdominal pain: Secondary | ICD-10-CM | POA: Diagnosis not present

## 2022-09-05 DIAGNOSIS — R197 Diarrhea, unspecified: Secondary | ICD-10-CM | POA: Insufficient documentation

## 2022-09-05 HISTORY — DX: Essential (primary) hypertension: I10

## 2022-09-05 LAB — CBC WITH DIFFERENTIAL/PLATELET
Abs Immature Granulocytes: 0.01 10*3/uL (ref 0.00–0.07)
Basophils Absolute: 0 10*3/uL (ref 0.0–0.1)
Basophils Relative: 0 %
Eosinophils Absolute: 0.1 10*3/uL (ref 0.0–0.5)
Eosinophils Relative: 2 %
HCT: 34.6 % — ABNORMAL LOW (ref 36.0–46.0)
Hemoglobin: 10.8 g/dL — ABNORMAL LOW (ref 12.0–15.0)
Immature Granulocytes: 0 %
Lymphocytes Relative: 24 %
Lymphs Abs: 0.9 10*3/uL (ref 0.7–4.0)
MCH: 24.5 pg — ABNORMAL LOW (ref 26.0–34.0)
MCHC: 31.2 g/dL (ref 30.0–36.0)
MCV: 78.5 fL — ABNORMAL LOW (ref 80.0–100.0)
Monocytes Absolute: 0.6 10*3/uL (ref 0.1–1.0)
Monocytes Relative: 17 %
Neutro Abs: 2.1 10*3/uL (ref 1.7–7.7)
Neutrophils Relative %: 57 %
Platelets: 205 10*3/uL (ref 150–400)
RBC: 4.41 MIL/uL (ref 3.87–5.11)
RDW: 15.4 % (ref 11.5–15.5)
WBC: 3.6 10*3/uL — ABNORMAL LOW (ref 4.0–10.5)
nRBC: 0 % (ref 0.0–0.2)

## 2022-09-05 LAB — COMPREHENSIVE METABOLIC PANEL
ALT: 17 U/L (ref 0–44)
AST: 23 U/L (ref 15–41)
Albumin: 3.8 g/dL (ref 3.5–5.0)
Alkaline Phosphatase: 77 U/L (ref 38–126)
Anion gap: 6 (ref 5–15)
BUN: 8 mg/dL (ref 6–20)
CO2: 24 mmol/L (ref 22–32)
Calcium: 8.4 mg/dL — ABNORMAL LOW (ref 8.9–10.3)
Chloride: 106 mmol/L (ref 98–111)
Creatinine, Ser: 0.6 mg/dL (ref 0.44–1.00)
GFR, Estimated: >60 mL/min (ref >60)
Glucose, Bld: 90 mg/dL (ref 70–99)
Potassium: 3.9 mmol/L (ref 3.5–5.1)
Sodium: 136 mmol/L (ref 135–145)
Total Bilirubin: 0.3 mg/dL (ref 0.3–1.2)
Total Protein: 7.6 g/dL (ref 6.5–8.1)

## 2022-09-05 LAB — URINALYSIS, ROUTINE W REFLEX MICROSCOPIC
Bilirubin Urine: NEGATIVE
Glucose, UA: NEGATIVE mg/dL
Hgb urine dipstick: NEGATIVE
Ketones, ur: NEGATIVE mg/dL
Leukocytes,Ua: NEGATIVE
Nitrite: NEGATIVE
Protein, ur: NEGATIVE mg/dL
Specific Gravity, Urine: 1.02 (ref 1.005–1.030)
pH: 6.5 (ref 5.0–8.0)

## 2022-09-05 LAB — PREGNANCY, URINE: Preg Test, Ur: NEGATIVE

## 2022-09-05 LAB — LIPASE, BLOOD: Lipase: 35 U/L (ref 11–51)

## 2022-09-05 MED ORDER — HYDROMORPHONE HCL 1 MG/ML IJ SOLN
0.5000 mg | Freq: Once | INTRAMUSCULAR | Status: AC
  Administered 2022-09-05: 0.5 mg via INTRAVENOUS
  Filled 2022-09-05: qty 1

## 2022-09-05 MED ORDER — IOHEXOL 300 MG/ML  SOLN
100.0000 mL | Freq: Once | INTRAMUSCULAR | Status: AC | PRN
  Administered 2022-09-05: 100 mL via INTRAVENOUS

## 2022-09-05 MED ORDER — HYDROCODONE-ACETAMINOPHEN 5-325 MG PO TABS: 1.0000 | ORAL_TABLET | Freq: Four times a day (QID) | ORAL | 0 refills | Status: AC | PRN

## 2022-09-05 MED ORDER — SODIUM CHLORIDE 0.9 % IV SOLN: INTRAVENOUS | Status: DC

## 2022-09-05 MED ORDER — LOPERAMIDE HCL 2 MG PO TABS: 2.0000 mg | ORAL_TABLET | Freq: Four times a day (QID) | ORAL | 0 refills | Status: AC | PRN

## 2022-09-05 MED ORDER — ONDANSETRON HCL 4 MG/2ML IJ SOLN
4.0000 mg | Freq: Once | INTRAMUSCULAR | Status: AC
  Administered 2022-09-05: 4 mg via INTRAVENOUS
  Filled 2022-09-05: qty 2

## 2022-09-05 NOTE — ED Provider Notes (Addendum)
[facility] EMERGENCY DEPARTMENT Provider Note   CSN: [id number] Arrival date & time: 09/05/22  1542     History  Chief Complaint  Patient presents with   Abdominal Pain    Stephanie Washington is a 31 y.o. female.  Patient with onset of right back pain radiated to the lower quadrant started yesterday.  Now kind of all over but still more predominant in the back area patient was putting a warming blanket in that area.  No nausea no vomiting does have nausea and did have diarrhea today no blood in the diarrhea.  Has had some vaginal spotting but no dysuria.  Patient states she has had similar pain in the past and she thought that they told her it was due to ovarian cyst or kidney cyst.  Past medical history significant for hypertension blood pressure here 132/97.  Patient not on any antihypertensive meds.  Patient is never used tobacco products.       Home Medications Prior to Admission medications   Medication Sig Start Date End Date Taking? Authorizing Provider  HYDROcodone-acetaminophen (NORCO/VICODIN) 5-325 MG tablet Take 1-2 tablets by mouth every 4 (four) hours as needed for moderate pain or severe pain. 12/14/16   Trixie Dredge, PA-C  ondansetron (ZOFRAN) 4 MG tablet Take 1 tablet (4 mg total) by mouth every 8 (eight) hours as needed for nausea or vomiting. 12/14/16   Trixie Dredge, PA-C      Allergies    Patient has no known allergies.    Review of Systems   Review of Systems  Constitutional:  Negative for chills and fever.  HENT:  Negative for congestion, rhinorrhea and sore throat.   Eyes:  Negative for visual disturbance.  Respiratory:  Negative for cough and shortness of breath.   Cardiovascular:  Negative for chest pain and leg swelling.  Gastrointestinal:  Positive for abdominal pain, diarrhea and nausea. Negative for vomiting.  Genitourinary:  Positive for vaginal bleeding. Negative for dysuria.  Musculoskeletal:  Positive for back pain. Negative for neck pain.   Skin:  Negative for rash.  Neurological:  Negative for dizziness, light-headedness and headaches.  Hematological:  Does not bruise/bleed easily.  Psychiatric/Behavioral:  Negative for confusion.     Physical Exam Updated Vital Signs BP (!) 129/101   Pulse 70   Temp 98.4 F (36.9 C) (Oral)   Resp 17   Ht 1.524 m (5')   Wt 72.6 kg   LMP 08/22/2022 (Approximate)   SpO2 100%   BMI 31.25 kg/m  Physical Exam Vitals and nursing note reviewed.  Constitutional:      General: She is not in acute distress.    Appearance: She is well-developed.  HENT:     Head: Normocephalic and atraumatic.  Eyes:     Conjunctiva/sclera: Conjunctivae normal.  Cardiovascular:     Rate and Rhythm: Normal rate and regular rhythm.     Heart sounds: No murmur heard. Pulmonary:     Effort: Pulmonary effort is normal. No respiratory distress.     Breath sounds: Normal breath sounds.  Abdominal:     General: There is no distension.     Palpations: Abdomen is soft.     Tenderness: There is generalized abdominal tenderness. There is no guarding.     Hernia: No hernia is present.  Musculoskeletal:        General: No swelling.     Cervical back: Neck supple.  Skin:    General: Skin is warm and dry.  Capillary Refill: Capillary refill takes less than 2 seconds.  Neurological:     General: No focal deficit present.     Mental Status: She is alert and oriented to person, place, and time.  Psychiatric:        Mood and Affect: Mood normal.     ED Results / Procedures / Treatments   Labs (all labs ordered are listed, but only abnormal results are displayed) Labs Reviewed  CBC WITH DIFFERENTIAL/PLATELET - Abnormal; Notable for the following components:      Result Value   WBC 3.6 (*)    Hemoglobin 10.8 (*)    HCT 34.6 (*)    MCV 78.5 (*)    MCH 24.5 (*)    All other components within normal limits  COMPREHENSIVE METABOLIC PANEL - Abnormal; Notable for the following components:   Calcium 8.4  (*)    All other components within normal limits  LIPASE, BLOOD  URINALYSIS, ROUTINE W REFLEX MICROSCOPIC  PREGNANCY, URINE    EKG None  Radiology CT Abdomen Pelvis W Contrast  Result Date: 09/05/2022 CLINICAL DATA:  A 31 year old female presents for evaluation of acute nonlocalized abdominal pain. EXAM: CT ABDOMEN AND PELVIS WITH CONTRAST TECHNIQUE: Multidetector CT imaging of the abdomen and pelvis was performed using the standard protocol following bolus administration of intravenous contrast. RADIATION DOSE REDUCTION: This exam was performed according to the departmental dose-optimization program which includes automated exposure control, adjustment of the mA and/or kV according to patient size and/or use of iterative reconstruction technique. CONTRAST:  OMNIPAQUE IOHEXOL 300 MG/ML  SOLN COMPARISON:  May 18, 2021. FINDINGS: Lower chest: Unremarkable. Hepatobiliary: No focal, suspicious hepatic lesion. No pericholecystic stranding. No biliary duct dilation. Portal vein is patent. Focal fat about the inter lobar fissure similar to previous imaging. Pancreas: Normal, without mass, inflammation or ductal dilatation. Spleen: Normal. Adrenals/Urinary Tract: Adrenal glands are normal. Symmetric renal enhancement without hydronephrosis or suspicious renal lesion with Bosniak category II cyst of the upper pole the RIGHT kidney measures 2.9 cm with a density of 10 Hounsfield units. No additional dedicated imaging follow-up is recommended for this finding. No ureteral dilation.  No perivesical stranding. Stomach/Bowel: Query mild haustral thickening of the mid transverse colon though the colon is under distended. No stranding adjacent to the stomach. No sign of small bowel obstruction or inflammation. Normal appendix. Mildly distended small bowel loops some containing stool like material in the low abdomen and others containing fluid without adjacent stranding. Vascular/Lymphatic: Aorta with smooth  contours. IVC with smooth contours. No aneurysmal dilation of the abdominal aorta. There is no gastrohepatic or hepatoduodenal ligament lymphadenopathy. No retroperitoneal or mesenteric lymphadenopathy. No pelvic sidewall lymphadenopathy. Reproductive: Unremarkable by CT without change since previous imaging. Other: No ascites.  No pneumoperitoneum. Musculoskeletal: No acute or significant osseous findings. IMPRESSION: 1. Normal appendix. 2. Query mild haustral thickening of the mid transverse colon though the colon is under distended. Correlate with any signs of mild colitis. 3. Mildly distended small bowel loops some containing stool like material in the low abdomen and others containing fluid without adjacent stranding. Findings may reflect delayed enteric transit or mild ileus but there was some mild distension and fluid-filled appearance of small-bowel loops on previous imaging. Electronically Signed   By: Donzetta Kohut M.D.   On: 09/05/2022 18:19    Procedures Procedures    Medications Ordered in ED Medications  0.9 %  sodium chloride infusion ( Intravenous New Bag/Given 09/05/22 1643)  HYDROmorphone (DILAUDID) injection 0.5 mg (  0.5 mg Intravenous Given 09/05/22 1641)  ondansetron (ZOFRAN) injection 4 mg (4 mg Intravenous Given 09/05/22 1641)  iohexol (OMNIPAQUE) 300 MG/ML solution 100 mL (100 mLs Intravenous Contrast Given 09/05/22 1749)    ED Course/ Medical Decision Making/ A&P                           Medical Decision Making Amount and/or Complexity of Data Reviewed Labs: ordered. Radiology: ordered.  Risk Prescription drug management.   Patient's urinalysis here negative for any urinary tract infection.  Very normal.  Pregnancy test negative.  CBC white blood cell count 3.6 hemoglobin 10.8 platelets are 205.  Complete metabolic panel renal function normal electrolytes normal LFTs are normal.  Calcium down a little bit at 8.4.  Anion gap is 6 lipase normal at 35.  ET scan  abdomen pelvis was done because of the broad nature of the pain all over.  Did not seem to be limited just to the lower quadrants or the ovary area.  Did start in the back.  Disposition will based on CT scan results.  Patient nontoxic no acute distress.  CT scan probably consistent with a diarrhea illness.  May be some mild colitis.  Some air-fluid levels but no signs of obstruction.  No other acute findings.  Patient feeling much better.  Will treat symptomatically.   Final Clinical Impression(s) / ED Diagnoses Final diagnoses:  Generalized abdominal pain  Diarrhea, unspecified type    Rx / DC Orders ED Discharge Orders     None         Vanetta Mulders, MD 09/05/22 1750    Vanetta Mulders, MD 09/05/22 [phone]

## 2022-09-05 NOTE — ED Notes (Signed)
Pt endorses 1x day of diarrhea and taut belly. No gas, no hemorrhage noted. No trauma or recent illness. Sudden onset yesterday at work around 1600hrs. Feels like UTI, started at R flank and moves down abd, through pelvis and genitals. Hx of cysts.

## 2022-09-05 NOTE — Discharge Instructions (Addendum)
Take the Imodium A-D as needed to help slow down the diarrhea.  Food choices to help with the diarrhea provided.  Take the hydrocodone as needed for pain relief.  Workup here today to include CT scan without any acute findings.  Would expect improvement over the next 24 hours.  Return for any new or worse symptoms.

## 2022-09-05 NOTE — ED Triage Notes (Signed)
Generalized abdominal pain x 2 days. Reports hx of multiple cyst's and feels like they are "acting up" Also endorses vaginal spotting. Denies urinary sx.

## 2022-09-05 NOTE — ED Notes (Signed)
Dc instructions reviewed with pt no questions or concerns at this time. Will follow up with OB no questions or concerns at this time

## 2023-09-25 ENCOUNTER — Emergency Department (HOSPITAL_BASED_OUTPATIENT_CLINIC_OR_DEPARTMENT_OTHER)
Admission: EM | Admit: 2023-09-25 | Discharge: 2023-09-25 | Disposition: A | Discharge status: Home / Self Care | Payer: MEDICAID | Attending: Emergency Medicine | Admitting: Emergency Medicine

## 2023-09-25 ENCOUNTER — Encounter (HOSPITAL_BASED_OUTPATIENT_CLINIC_OR_DEPARTMENT_OTHER): Payer: Self-pay | Admitting: Urology

## 2023-09-25 ENCOUNTER — Other Ambulatory Visit (HOSPITAL_BASED_OUTPATIENT_CLINIC_OR_DEPARTMENT_OTHER): Payer: Self-pay

## 2023-09-25 ENCOUNTER — Emergency Department (HOSPITAL_BASED_OUTPATIENT_CLINIC_OR_DEPARTMENT_OTHER): Payer: MEDICAID

## 2023-09-25 ENCOUNTER — Other Ambulatory Visit: Payer: Self-pay

## 2023-09-25 DIAGNOSIS — R110 Nausea: Secondary | ICD-10-CM | POA: Insufficient documentation

## 2023-09-25 DIAGNOSIS — R1084 Generalized abdominal pain: Secondary | ICD-10-CM | POA: Insufficient documentation

## 2023-09-25 DIAGNOSIS — I10 Essential (primary) hypertension: Secondary | ICD-10-CM | POA: Insufficient documentation

## 2023-09-25 LAB — COMPREHENSIVE METABOLIC PANEL
ALT: 14 U/L (ref 0–44)
AST: 20 U/L (ref 15–41)
Albumin: 3.6 g/dL (ref 3.5–5.0)
Alkaline Phosphatase: 67 U/L (ref 38–126)
Anion gap: 5 (ref 5–15)
BUN: 9 mg/dL (ref 6–20)
CO2: 23 mmol/L (ref 22–32)
Calcium: 8.5 mg/dL — ABNORMAL LOW (ref 8.9–10.3)
Chloride: 109 mmol/L (ref 98–111)
Creatinine, Ser: 0.61 mg/dL (ref 0.44–1.00)
GFR, Estimated: >60 mL/min (ref >60)
Glucose, Bld: 89 mg/dL (ref 70–99)
Potassium: 4.3 mmol/L (ref 3.5–5.1)
Sodium: 137 mmol/L (ref 135–145)
Total Bilirubin: 0.4 mg/dL (ref <1.2)
Total Protein: 7.2 g/dL (ref 6.5–8.1)

## 2023-09-25 LAB — URINALYSIS, ROUTINE W REFLEX MICROSCOPIC
Bilirubin Urine: NEGATIVE
Glucose, UA: NEGATIVE mg/dL
Hgb urine dipstick: NEGATIVE
Ketones, ur: NEGATIVE mg/dL
Leukocytes,Ua: NEGATIVE
Nitrite: NEGATIVE
Protein, ur: NEGATIVE mg/dL
Specific Gravity, Urine: 1.02 (ref 1.005–1.030)
pH: 5.5 (ref 5.0–8.0)

## 2023-09-25 LAB — CBC
HCT: 33 % — ABNORMAL LOW (ref 36.0–46.0)
Hemoglobin: 9.9 g/dL — ABNORMAL LOW (ref 12.0–15.0)
MCH: 23.3 pg — ABNORMAL LOW (ref 26.0–34.0)
MCHC: 30 g/dL (ref 30.0–36.0)
MCV: 77.6 fL — ABNORMAL LOW (ref 80.0–100.0)
Platelets: 255 10*3/uL (ref 150–400)
RBC: 4.25 MIL/uL (ref 3.87–5.11)
RDW: 17.4 % — ABNORMAL HIGH (ref 11.5–15.5)
WBC: 5.6 10*3/uL (ref 4.0–10.5)
nRBC: 0 % (ref 0.0–0.2)

## 2023-09-25 LAB — PREGNANCY, URINE: Preg Test, Ur: NEGATIVE

## 2023-09-25 LAB — LIPASE, BLOOD: Lipase: 34 U/L (ref 11–51)

## 2023-09-25 MED ORDER — DICYCLOMINE HCL 20 MG PO TABS
20.0000 mg | ORAL_TABLET | Freq: Two times a day (BID) | ORAL | 0 refills | Status: AC
  Filled 2023-09-25: qty 20, 10d supply, fill #0

## 2023-09-25 MED ORDER — MORPHINE SULFATE (PF) 4 MG/ML IV SOLN
4.0000 mg | Freq: Once | INTRAVENOUS | Status: AC
  Administered 2023-09-25: 4 mg via INTRAVENOUS
  Filled 2023-09-25: qty 1

## 2023-09-25 MED ORDER — ONDANSETRON 4 MG PO TBDP
4.0000 mg | ORAL_TABLET | Freq: Three times a day (TID) | ORAL | 0 refills | Status: AC | PRN
  Filled 2023-09-25: qty 20, 7d supply, fill #0

## 2023-09-25 MED ORDER — IOHEXOL 300 MG/ML  SOLN
100.0000 mL | Freq: Once | INTRAMUSCULAR | Status: AC | PRN
  Administered 2023-09-25: 100 mL via INTRAVENOUS

## 2023-09-25 MED ORDER — ONDANSETRON HCL 4 MG/2ML IJ SOLN
4.0000 mg | Freq: Once | INTRAMUSCULAR | Status: AC
  Administered 2023-09-25: 4 mg via INTRAVENOUS
  Filled 2023-09-25: qty 2

## 2023-09-25 NOTE — ED Triage Notes (Signed)
Pt states generalized abd pain x 2 days  Reports Nausea, pt states constipation  States " my appendix has been hurting"

## 2023-09-25 NOTE — ED Notes (Signed)
Given patient water for PO Challenge.  Patient passed. Provider notified.

## 2023-09-25 NOTE — ED Provider Notes (Signed)
[facility] Provider Note   CSN: [id number] Arrival date & time: 09/25/23  1344     History  Chief Complaint  Patient presents with   Abdominal Pain    Stephanie Washington is a 32 y.o. female with a past medical history significant for hypertension who presents to the ED due to generalized abdominal pain that radiates to her bilateral low back x 3 days.  Patient states abdominal pain has worsened over the past 24 hours.  Abdominal pain associated with nausea.  No vomiting or diarrhea.  Denies vaginal discharge.  No concern for STIs.  Denies any dysuria.  No previous abdominal operations.  Finished her menstrual cycle last week.  No concern for pregnancy.  History obtained from patient and past medical records. No interpreter used during encounter.       Home Medications Prior to Admission medications   Medication Sig Start Date End Date Taking? Authorizing Provider  dicyclomine (BENTYL) 20 MG tablet Take 1 tablet (20 mg total) by mouth 2 (two) times daily. 09/25/23  Yes Odyssey Vasbinder C, PA-C  ondansetron (ZOFRAN-ODT) 4 MG disintegrating tablet Take 1 tablet (4 mg total) by mouth every 8 (eight) hours as needed for nausea or vomiting. 09/25/23  Yes Dearies Meikle, Merla Riches, PA-C  HYDROcodone-acetaminophen (NORCO/VICODIN) 5-325 MG tablet Take 1-2 tablets by mouth every 4 (four) hours as needed for moderate pain or severe pain. 12/14/16   Trixie Dredge, PA-C  HYDROcodone-acetaminophen (NORCO/VICODIN) 5-325 MG tablet Take 1 tablet by mouth every 6 (six) hours as needed for moderate pain. 09/05/22   Vanetta Mulders, MD  loperamide (IMODIUM A-D) 2 MG tablet Take 1 tablet (2 mg total) by mouth 4 (four) times daily as needed for diarrhea or loose stools. 09/05/22   Vanetta Mulders, MD  ondansetron (ZOFRAN) 4 MG tablet Take 1 tablet (4 mg total) by mouth every 8 (eight) hours as needed for nausea or vomiting. 12/14/16   Trixie Dredge, PA-C      Allergies     Patient has no known allergies.    Review of Systems   Review of Systems  Constitutional:  Negative for fever.  Respiratory:  Negative for shortness of breath.   Cardiovascular:  Negative for chest pain.  Gastrointestinal:  Positive for abdominal pain and nausea. Negative for diarrhea and vomiting.    Physical Exam Updated Vital Signs BP 114/79 (BP Location: Left Arm)   Pulse 74   Temp 98.3 F (36.8 C) (Oral)   Resp 20   Ht 5' (1.524 m)   Wt 72.6 kg   LMP 09/22/2023 Comment: neg upreg in er 09/25/23  SpO2 99%   BMI 31.26 kg/m  Physical Exam Vitals and nursing note reviewed.  Constitutional:      General: She is not in acute distress.    Appearance: She is not ill-appearing.  HENT:     Head: Normocephalic.  Eyes:     Pupils: Pupils are equal, round, and reactive to light.  Cardiovascular:     Rate and Rhythm: Normal rate and regular rhythm.     Pulses: Normal pulses.     Heart sounds: Normal heart sounds. No murmur heard.    No friction rub. No gallop.  Pulmonary:     Effort: Pulmonary effort is normal.     Breath sounds: Normal breath sounds.  Abdominal:     General: Abdomen is flat. There is no distension.     Palpations: Abdomen is soft.  Tenderness: There is abdominal tenderness. There is no guarding or rebound.     Comments: Diffuse tenderness without rebound or guarding  Musculoskeletal:        General: Normal range of motion.     Cervical back: Neck supple.  Skin:    General: Skin is warm and dry.  Neurological:     General: No focal deficit present.     Mental Status: She is alert.  Psychiatric:        Mood and Affect: Mood normal.        Behavior: Behavior normal.     ED Results / Procedures / Treatments   Labs (all labs ordered are listed, but only abnormal results are displayed) Labs Reviewed  COMPREHENSIVE METABOLIC PANEL - Abnormal; Notable for the following components:      Result Value   Calcium 8.5 (*)    All other components  within normal limits  CBC - Abnormal; Notable for the following components:   Hemoglobin 9.9 (*)    HCT 33.0 (*)    MCV 77.6 (*)    MCH 23.3 (*)    RDW 17.4 (*)    All other components within normal limits  LIPASE, BLOOD  URINALYSIS, ROUTINE W REFLEX MICROSCOPIC  PREGNANCY, URINE    EKG None  Radiology CT ABDOMEN PELVIS W CONTRAST  Result Date: 09/25/2023 CLINICAL DATA:  Abdominal pain for the past 2 days. Nausea and constipation. EXAM: CT ABDOMEN AND PELVIS WITH CONTRAST TECHNIQUE: Multidetector CT imaging of the abdomen and pelvis was performed using the standard protocol following bolus administration of intravenous contrast. RADIATION DOSE REDUCTION: This exam was performed according to the departmental dose-optimization program which includes automated exposure control, adjustment of the mA and/or kV according to patient size and/or use of iterative reconstruction technique. CONTRAST:  OMNIPAQUE IOHEXOL 300 MG/ML  SOLN COMPARISON:  CT abdomen pelvis dated September 05, 2022. FINDINGS: Lower chest: No acute abnormality. Hepatobiliary: No focal liver abnormality is seen. No gallstones, gallbladder wall thickening, or biliary dilatation. Pancreas: Unremarkable. No pancreatic ductal dilatation or surrounding inflammatory changes. Spleen: Normal in size without focal abnormality. Adrenals/Urinary Tract: Adrenal glands are unremarkable. Unchanged 3.0 cm simple cyst in the upper pole of the right kidney. No follow-up imaging is recommended. No solid renal lesion, calculi, or hydronephrosis. The bladder is unremarkable for the degree of distention. Stomach/Bowel: Stomach is within normal limits. Appendix appears normal. No evidence of bowel wall thickening, distention, or inflammatory changes. Vascular/Lymphatic: No significant vascular findings are present. No enlarged abdominal or pelvic lymph nodes. Reproductive: Uterus and bilateral adnexa are unremarkable. Other: No free fluid or  pneumoperitoneum. Musculoskeletal: No acute or significant osseous findings. IMPRESSION: 1. No acute intra-abdominal process. Electronically Signed   By: Obie Dredge M.D.   On: 09/25/2023 16:01    Procedures Procedures    Medications Ordered in ED Medications  morphine (PF) 4 MG/ML injection 4 mg (4 mg Intravenous Given 09/25/23 1446)  ondansetron (ZOFRAN) injection 4 mg (4 mg Intravenous Given 09/25/23 1446)  iohexol (OMNIPAQUE) 300 MG/ML solution 100 mL (100 mLs Intravenous Contrast Given 09/25/23 1451)    ED Course/ Medical Decision Making/ A&P                                 Medical Decision Making Amount and/or Complexity of Data Reviewed Labs: ordered. Decision-making details documented in ED Course. Radiology: ordered and independent interpretation performed. Decision-making details documented in  ED Course.  Risk Prescription drug management.   This patient presents to the ED for concern of abdominal pain, this involves an extensive number of treatment options, and is a complaint that carries with it a high risk of complications and morbidity.  The differential diagnosis includes acute cholecystitis, appendicitis, diverticulitis, gastroenteritis, etc  32 year old female presents to the ED due to generalized abdominal pain associated with nausea x 3 days.  No urinary or vaginal symptoms.  Upon arrival, vitals all within normal limits.  Patient in no acute distress.  Abdomen soft, nondistended with diffuse tenderness without rebound or guarding.  Routine labs ordered in triage.  CT abdomen ordered.  IV morphine and zofran given.   CBC with no leukocytosis.  Hemoglobin at 9.9, year ago hemoglobin 10.8.  Patient recently finished her menstrual cycle.  No further bleeding.  Pregnancy test negative.  Low suspicion for ectopic pregnancy.  Lipase normal.  Low suspicion for pancreatitis.  CMP reassuring.  Normal renal function.  No major electrolyte derangements.  Normal LFTs.  UA  unremarkable.  No signs of infection.  Low suspicion for pyelonephritis.  CT abdomen personally reviewed and interpreted negative for any acute abnormalities.   Reassessed patient, patient admits to improvement in pain. Able to tolerate po. Patient stable for discharge. Strict ED precautions discussed with patient. Patient states understanding and agrees to plan. Patient discharged home in no acute distress and stable vitals  Co morbidities that complicate the patient evaluation  HTN  Social Determinants of Health:  No PCP on file        Final Clinical Impression(s) / ED Diagnoses Final diagnoses:  Generalized abdominal pain  Nausea    Rx / DC Orders ED Discharge Orders          Ordered    ondansetron (ZOFRAN-ODT) 4 MG disintegrating tablet  Every 8 hours PRN        09/25/23 1611    dicyclomine (BENTYL) 20 MG tablet  2 times daily        09/25/23 1611              Mannie Stabile, PA-C 09/25/23 1624    Loetta Rough, MD 09/25/23 2357

## 2023-09-25 NOTE — Discharge Instructions (Signed)
It was a pleasure taking care of you today.  As discussed, your CT scan was unremarkable.  All of your labs are reassuring.  I am sending you home with pain medication.  Take As needed for abdominal pain.  I am also sending you home with nausea medication.  Take as needed.  Please follow-up with PCP if symptoms do not improve over the next few days.  Return to the ER for new or worsening symptoms.

## 2023-10-07 ENCOUNTER — Other Ambulatory Visit (HOSPITAL_BASED_OUTPATIENT_CLINIC_OR_DEPARTMENT_OTHER): Payer: Self-pay

## 2024-06-06 ENCOUNTER — Emergency Department (HOSPITAL_BASED_OUTPATIENT_CLINIC_OR_DEPARTMENT_OTHER)
Admission: EM | Admit: 2024-06-06 | Discharge: 2024-06-06 | Disposition: A | Discharge status: Home / Self Care | Payer: MEDICAID

## 2024-06-06 ENCOUNTER — Encounter (HOSPITAL_BASED_OUTPATIENT_CLINIC_OR_DEPARTMENT_OTHER): Payer: Self-pay | Admitting: Emergency Medicine

## 2024-06-06 ENCOUNTER — Other Ambulatory Visit: Payer: Self-pay

## 2024-06-06 DIAGNOSIS — J039 Acute tonsillitis, unspecified: Secondary | ICD-10-CM | POA: Diagnosis not present

## 2024-06-06 DIAGNOSIS — J029 Acute pharyngitis, unspecified: Secondary | ICD-10-CM | POA: Diagnosis present

## 2024-06-06 LAB — GROUP A STREP BY PCR: Group A Strep by PCR: NOT DETECTED

## 2024-06-06 MED ORDER — LIDOCAINE VISCOUS HCL 2 % MT SOLN
15.0000 mL | Freq: Once | OROMUCOSAL | Status: AC
  Administered 2024-06-06: 15 mL via OROMUCOSAL
  Filled 2024-06-06: qty 15

## 2024-06-06 MED ORDER — DEXAMETHASONE 10 MG/ML FOR PEDIATRIC ORAL USE
10.0000 mg | Freq: Once | INTRAMUSCULAR | Status: AC
  Administered 2024-06-06: 10 mg via ORAL
  Filled 2024-06-06: qty 1

## 2024-06-06 NOTE — ED Triage Notes (Signed)
 Pt c/o sore throat since this morning; also reports vomiting which she says is because she had a few drinks last night

## 2024-06-06 NOTE — ED Provider Notes (Signed)
 [facility] Provider Note   CSN: [id number] Arrival date & time: 06/06/24  8190     Patient presents with: Sore Throat   Stephaney Steven is a 33 y.o. female.   33 year old female presents for evaluation of sore throat.  States this started this morning when she woke up and she is having some difficulty swallowing pain.  Admits to vomiting last night but states it is because she had some alcohol.  She denies any other symptoms or concerns at this time.   Sore Throat Pertinent negatives include no chest pain, no abdominal pain and no shortness of breath.       Prior to Admission medications   Medication Sig Start Date End Date Taking? Authorizing Provider  dicyclomine  (BENTYL ) 20 MG tablet Take 1 tablet (20 mg total) by mouth 2 (two) times daily. 09/25/23   Aberman, Caroline C, PA-C  HYDROcodone -acetaminophen  (NORCO/VICODIN) 5-325 MG tablet Take 1-2 tablets by mouth every 4 (four) hours as needed for moderate pain or severe pain. 12/14/16   Devora Perkins, PA-C  HYDROcodone -acetaminophen  (NORCO/VICODIN) 5-325 MG tablet Take 1 tablet by mouth every 6 (six) hours as needed for moderate pain. 09/05/22   Zackowski, Scott, MD  loperamide  (IMODIUM  A-D) 2 MG tablet Take 1 tablet (2 mg total) by mouth 4 (four) times daily as needed for diarrhea or loose stools. 09/05/22   Zackowski, Scott, MD  ondansetron  (ZOFRAN ) 4 MG tablet Take 1 tablet (4 mg total) by mouth every 8 (eight) hours as needed for nausea or vomiting. 12/14/16   Devora Perkins, PA-C  ondansetron  (ZOFRAN -ODT) 4 MG disintegrating tablet Take 1 tablet (4 mg total) by mouth every 8 (eight) hours as needed for nausea or vomiting. 09/25/23   Aberman, Caroline C, PA-C    Allergies: Patient has no known allergies.    Review of Systems  Constitutional:  Negative for chills and fever.  HENT:  Negative for ear pain and sore throat.   Eyes:  Negative for pain and visual disturbance.  Respiratory:   Negative for cough and shortness of breath.   Cardiovascular:  Negative for chest pain and palpitations.  Gastrointestinal:  Negative for abdominal pain and vomiting.  Genitourinary:  Negative for dysuria and hematuria.  Musculoskeletal:  Negative for arthralgias and back pain.  Skin:  Negative for color change and rash.  Neurological:  Negative for seizures and syncope.  All other systems reviewed and are negative.   Updated Vital Signs BP (!) 144/115 (BP Location: Right Arm)   Pulse (!) 113   Temp 97.6 F (36.4 C)   Resp 18   Ht 5' (1.524 m)   Wt 72.6 kg   SpO2 100%   BMI 31.26 kg/m   Physical Exam Vitals and nursing note reviewed.  Constitutional:      General: She is not in acute distress.    Appearance: She is well-developed.  HENT:     Head: Normocephalic and atraumatic.     Mouth/Throat:     Mouth: Mucous membranes are moist.     Pharynx: Pharyngeal swelling and posterior oropharyngeal erythema present. No oropharyngeal exudate.  Eyes:     Conjunctiva/sclera: Conjunctivae normal.  Cardiovascular:     Rate and Rhythm: Normal rate and regular rhythm.     Heart sounds: No murmur heard. Pulmonary:     Effort: Pulmonary effort is normal. No respiratory distress.     Breath sounds: Normal breath sounds.  Abdominal:     Palpations: Abdomen  is soft.     Tenderness: There is no abdominal tenderness.  Musculoskeletal:        General: No swelling.     Cervical back: Neck supple.  Skin:    General: Skin is warm and dry.     Capillary Refill: Capillary refill takes less than 2 seconds.  Neurological:     Mental Status: She is alert.  Psychiatric:        Mood and Affect: Mood normal.     (all labs ordered are listed, but only abnormal results are displayed) Labs Reviewed  GROUP A STREP BY PCR    EKG: None  Radiology: No results found.   Procedures   Medications Ordered in the ED  lidocaine  (XYLOCAINE ) 2 % viscous mouth solution 15 mL (15 mLs  Mouth/Throat Given 06/06/24 1905)  dexamethasone  (DECADRON ) 10 MG/ML injection for Pediatric ORAL use 10 mg (10 mg Oral Given 06/06/24 1905)                                    Medical Decision Making Patient here for sore throat, strep is negative.  Likely tonsillitis.  Given Decadron  and viscous lidocaine  here.  Advised Tylenol  Motrin as needed at home and other over-the-counter medications as needed for her symptoms.  Feels comfortable being discharged.  Problems Addressed: Tonsillitis: acute illness or injury  Amount and/or Complexity of Data Reviewed External Data Reviewed: notes.    Details: Prior ED records reviewed and patient last seen in the ER in December 2024 for abdominal pain Labs: ordered. Decision-making details documented in ED Course.    Details: Ordered and reviewed and a strep is negative  Risk OTC drugs. Prescription drug management.     Final diagnoses:  Tonsillitis    ED Discharge Orders     None          Gennaro Duwaine CROME, DO 06/06/24 2108

## 2024-06-06 NOTE — Discharge Instructions (Addendum)
 Your strep test was negative.  You can use Tylenol  and Motrin as needed for pain and fever.  Follow-up with primary care as needed.
